# Patient Record
Sex: Female | Born: 1955
Health system: Southern US, Community
[De-identification: ages and names within clinical notes are randomized; demographics above are authoritative.]

## PROBLEM LIST (undated history)

## (undated) DIAGNOSIS — E079 Disorder of thyroid, unspecified: Secondary | ICD-10-CM

## (undated) DIAGNOSIS — E119 Type 2 diabetes mellitus without complications: Secondary | ICD-10-CM

---

## 2002-06-24 ENCOUNTER — Encounter: Payer: Self-pay | Admitting: Neurosurgery

## 2002-06-24 ENCOUNTER — Inpatient Hospital Stay (HOSPITAL_COMMUNITY): Admission: RE | Admit: 2002-06-24 | Discharge: 2002-06-26 | Payer: Self-pay | Admitting: Neurosurgery

## 2002-06-25 ENCOUNTER — Encounter: Payer: Self-pay | Admitting: Cardiology

## 2002-07-16 ENCOUNTER — Encounter: Admission: RE | Admit: 2002-07-16 | Discharge: 2002-10-14 | Payer: Self-pay | Admitting: Family Medicine

## 2003-02-13 ENCOUNTER — Encounter: Payer: Self-pay | Admitting: Emergency Medicine

## 2003-02-13 ENCOUNTER — Emergency Department (HOSPITAL_COMMUNITY): Admission: EM | Admit: 2003-02-13 | Discharge: 2003-02-13 | Payer: Self-pay | Admitting: Emergency Medicine

## 2003-05-29 ENCOUNTER — Encounter: Payer: Self-pay | Admitting: Family Medicine

## 2003-05-29 ENCOUNTER — Encounter: Admission: RE | Admit: 2003-05-29 | Discharge: 2003-05-29 | Payer: Self-pay | Admitting: Family Medicine

## 2003-06-04 ENCOUNTER — Encounter: Admission: RE | Admit: 2003-06-04 | Discharge: 2003-08-26 | Payer: Self-pay | Admitting: Neurosurgery

## 2003-12-11 ENCOUNTER — Ambulatory Visit (HOSPITAL_COMMUNITY): Admission: RE | Admit: 2003-12-11 | Discharge: 2003-12-11 | Payer: Self-pay | Admitting: Neurosurgery

## 2004-05-31 ENCOUNTER — Other Ambulatory Visit: Admission: RE | Admit: 2004-05-31 | Discharge: 2004-05-31 | Payer: Self-pay | Admitting: Family Medicine

## 2005-07-17 ENCOUNTER — Other Ambulatory Visit: Admission: RE | Admit: 2005-07-17 | Discharge: 2005-07-17 | Payer: Self-pay | Admitting: Family Medicine

## 2005-08-11 ENCOUNTER — Encounter: Admission: RE | Admit: 2005-08-11 | Discharge: 2005-08-11 | Payer: Self-pay | Admitting: Neurosurgery

## 2006-11-28 ENCOUNTER — Encounter (INDEPENDENT_AMBULATORY_CARE_PROVIDER_SITE_OTHER): Payer: Self-pay | Admitting: Specialist

## 2006-11-28 ENCOUNTER — Inpatient Hospital Stay (HOSPITAL_COMMUNITY): Admission: EM | Admit: 2006-11-28 | Discharge: 2006-11-29 | Payer: Self-pay | Admitting: Emergency Medicine

## 2007-09-09 ENCOUNTER — Other Ambulatory Visit: Admission: RE | Admit: 2007-09-09 | Discharge: 2007-09-09 | Payer: Self-pay | Admitting: Family Medicine

## 2007-10-01 ENCOUNTER — Inpatient Hospital Stay (HOSPITAL_COMMUNITY): Admission: EM | Admit: 2007-10-01 | Discharge: 2007-10-04 | Payer: Self-pay | Admitting: Emergency Medicine

## 2008-11-04 ENCOUNTER — Encounter: Admission: RE | Admit: 2008-11-04 | Discharge: 2008-11-04 | Payer: Self-pay | Admitting: Family Medicine

## 2008-11-12 ENCOUNTER — Encounter: Admission: RE | Admit: 2008-11-12 | Discharge: 2008-11-12 | Payer: Self-pay | Admitting: Family Medicine

## 2008-11-18 ENCOUNTER — Encounter: Admission: RE | Admit: 2008-11-18 | Discharge: 2008-11-18 | Payer: Self-pay | Admitting: Family Medicine

## 2010-04-27 ENCOUNTER — Encounter: Admission: RE | Admit: 2010-04-27 | Discharge: 2010-04-27 | Payer: Self-pay | Admitting: Family Medicine

## 2010-05-06 ENCOUNTER — Encounter: Admission: RE | Admit: 2010-05-06 | Discharge: 2010-06-13 | Payer: Self-pay | Admitting: Family Medicine

## 2010-11-02 ENCOUNTER — Encounter (HOSPITAL_BASED_OUTPATIENT_CLINIC_OR_DEPARTMENT_OTHER)
Admission: RE | Admit: 2010-11-02 | Discharge: 2010-11-15 | Payer: Self-pay | Source: Home / Self Care | Attending: General Surgery | Admitting: General Surgery

## 2010-11-02 ENCOUNTER — Ambulatory Visit (HOSPITAL_COMMUNITY)
Admission: RE | Admit: 2010-11-02 | Discharge: 2010-11-02 | Payer: Self-pay | Source: Home / Self Care | Attending: General Surgery | Admitting: General Surgery

## 2010-11-09 LAB — GLUCOSE, CAPILLARY: Glucose-Capillary: 166 mg/dL — ABNORMAL HIGH (ref 70–99)

## 2010-11-16 ENCOUNTER — Encounter (HOSPITAL_BASED_OUTPATIENT_CLINIC_OR_DEPARTMENT_OTHER): Payer: Self-pay

## 2010-12-21 ENCOUNTER — Encounter (HOSPITAL_BASED_OUTPATIENT_CLINIC_OR_DEPARTMENT_OTHER): Payer: Self-pay

## 2011-01-18 ENCOUNTER — Encounter (HOSPITAL_BASED_OUTPATIENT_CLINIC_OR_DEPARTMENT_OTHER): Payer: Self-pay

## 2011-02-28 NOTE — Consult Note (Signed)
NAME:  Allison [name], Allison [name]                    ACCOUNT NO.:  [id number]   MEDICAL RECORD NO.:  [id number]          PATIENT TYPE:  INP   LOCATION:  6713                         FACILITY:  [facility]   PHYSICIAN:  Danise Edge, M.D.   DATE OF BIRTH:  March 22, 1956   DATE OF CONSULTATION:  10/03/2007  DATE OF DISCHARGE:                                 CONSULTATION   REASON FOR CONSULTATION:  We were asked to see Allison [name] today in  consultation for colitis by Dr. Donnalee Curry of [facility].   HISTORY OF PRESENT ILLNESS:  This is a 55 year old female who developed  a tooth abscess recently and was put on amoxicillin.  She had taken it  for approximately 6 days prior to start of her symptoms.  She was also  started on hydrocodone.  She became constipated for 2 days and then  Tuesday night started to experience liquid bowel movements in which she  noticed some red blood.  She tells me that over the past 7 days, she has  been having 7-8 liquid bowel movements per day with an increase in  abdominal pain.  She describes no emesis.  She does have a history of  diabetes mellitus.  On CT scan, it shows that she had a thickening  segment in her ascending colon as well as part of her transverse colon.   PRIMARY CARE PHYSICIAN:  Dr. Sigmund Hazel at [facility].   PAST MEDICAL HISTORY:  1. Significant for atrial fibrillation.  2. Type 2 diabetes.  3. Tooth abscess in December 2008.  4. Hypothyroidism.   PAST SURGICAL HISTORY:  1. Cervical laminectomy.  2. Laparoscopic appendectomy.  3. The patient has never had a colonoscopy before.   ALLERGIES:  NO KNOWN DRUG ALLERGIES.   CURRENT MEDICATIONS:  1. Lipitor.  2. Levothyroxine.  3. Glyburide.  4. Metformin.   SOCIAL HISTORY:  Significant for no drugs, alcohol or tobacco.   FAMILY HISTORY:  Significant for no IBD, colon cancer or known bowel  problems.   PHYSICAL EXAMINATION:  GENERAL:  She is alert and oriented in no  apparent  distress.  VITAL SIGNS:  Temperature is 99, pulse 75, respirations are 18, blood  pressure is 107/70.  HEART:  Regular rate and rhythm with no murmurs, rubs or gallops  appreciated.  LUNGS:  Clear to auscultation bilaterally.  ABDOMEN:  Soft, tender in the lower quadrants bilaterally and has good  bowel sounds.   LABORATORY DATA:  Potassium 3.4, BUN 4, creatinine 0.57, hemoglobin  11.9, hematocrit 37.1, white count 8.9, platelets 136,000.  She is  guaiac positive.  Her lipase is normal.  LFTs are abnormal in that her  bilirubin is slightly high at 1.3.  Stool cultures including C. diff,  Giardia, Cryptosporidium and routine stools for enteric pathogen have  all been negative.   DIAGNOSTICS:  CT scan done yesterday shows a long segment of wall  thickening in the ascending and transverse colon.   ASSESSMENT:  Dr. Danise Edge has seen and examined the patient,  collected a history and reviewed the  chart.  His impression is that this  is a 55 year old female with colitis.  We are unsure at this point  whether it is infectious or ischemic.  Her hemoglobin is stable as is  the patient.  We will go ahead and replete her potassium and prep for a  colonoscopy this afternoon to be scheduled for tomorrow morning October 04, 2007.   Thanks very much for this consultation.      Allison Police, PA    ______________________________  Danise Edge, M.D.    MLY/MEDQ  D:  10/03/2007  T:  10/04/2007  Job:  [id number]   cc:   Danise Edge, M.D.  Kela Millin, M.D.  Sigmund Hazel, M.D.

## 2011-02-28 NOTE — H&P (Signed)
NAME:  Allison [name], Allison [name]                    ACCOUNT NO.:  [id number]   MEDICAL RECORD NO.:  [id number]          PATIENT TYPE:  INP   LOCATION:  1826                         FACILITY:  [facility]   PHYSICIAN:  Hollice Espy, M.D.DATE OF BIRTH:  July 28, 1956   DATE OF ADMISSION:  10/01/2007  DATE OF DISCHARGE:                              HISTORY & PHYSICAL   PCP:  Sigmund Hazel, MD   CHIEF COMPLAINT:  Abdominal pain.   HISTORY OF PRESENT ILLNESS:  The patient is a 55 year old [place]  female with past medical history of diabetes mellitus, who had some  followup dental work done about a month ago.  Since that time she has  had problems with severe pain and an abscess in the tooth requiring  antibiotics and OxyContin pain medication called in.  This medication  was started approximately 7 days ago.  She was able to take a couple of  those, but still having problems with severe nausea and vomiting.  These  symptoms have slowly persisted and she tells me that she stopped having  bowel movements about 3 days ago.  Her generalized abdominal pain  continued to progress.  The last several days she has had problems with  diarrhea as well.  Today she was feeling quite weak and noticed she was  having some bloody stool.  She has continued her generalized abdominal  pain, described as sharp, as well as crampy all over with no focal areas  of finding.  She went to [facility], who gave her PPI and  referred her here when her symptoms persisted.  When the patient came to  the emergency room she had labs done.  However, they did not do any type  of x-ray.  Her labs noted on her liver function test a total bilirubin  of 1.3 with an indirect of 1 which is the high end of normal.  She was  hemoccult positive.  Her labs did note normal electrolytes and normal H  and H, but did note a white count of 14.3 with 73% shift.  Stool was  sent for culture.  Currently the patient complains of occasional  episodes of severe generalized abdominal pain, cramping.  She feels  nauseated as well.  She denies any headaches or vision changes.  No  dysphagia.  No chest pain or palpitations.  No shortness of breath,  wheeze, or cough.  No hematuria or dysuria.  She has had both  constipation and diarrhea.  No focal extremity numbness, weakness, or  pain.   REVIEW OF SYSTEMS:  Otherwise negative.   PAST MEDICAL HISTORY:  Includes diabetes mellitus and hypothyroidism.   MEDICATIONS:  She is on Synthroid 75, Lipitor 10, glyburide 4, metformin  1000.   ALLERGIES:  NO KNOWN DRUG ALLERGIES.   SOCIAL HISTORY:  No tobacco, alcohol, or drug use.   FAMILY HISTORY:  Noncontributory.   PHYSICAL EXAMINATION:  VITAL SIGNS:  Temperature 96.9.  Heart rate 89.  Blood pressure 116/73.  Respirations 16.  O2 sat 97% on room air.  GENERAL:  She is  alert and oriented x3, in some moderate distress  secondary to abdominal pain.  HEENT:  Normocephalic, atraumatic.  Mucous membranes are dry.  She has  no carotid bruits.  HEART:  Regular rate and rhythm.  S1 and S2.  LUNGS:  Clear to auscultation bilaterally.  ABDOMEN:  Soft, distended.  Generalized tenderness.  Scant bowel sounds.  EXTREMITIES:  Show no clubbing, cyanosis, or edema.   LAB WORK:  Stool cultures pending.  Sodium 136, potassium 3.6, chloride  104, bicarb 23, BUN 6, creatinine 0.6, glucose 168.  INR normal.  C.  diff. culture pending.  LFTs noted a total bili of 1.3, indirect of 1,  hemoccult positive.  UA noted being only __________ for ketones.  Lipase  44.  White count 14.3.  H and H 14.2 and 45.  MCV of 65.  Platelet count  190.  No shifts.   ASSESSMENT AND PLAN:  1. Abdominal pain with associated nausea, vomiting, diarrhea, bloody      and increased leukocytosis.  Suspect a viral gastroenteritis versus      diverticulitis, versus constipation, versus ileus, check abdominal      x-ray.  In the meantime will treat with n.p.o., intravenous  fluids,      pain and nausea control, plus proton pump inhibitor.  2. Diabetes mellitus, n.p.o. plus sliding scale.  3. Hypothyroidism, holding Synthroid.      Hollice Espy, M.D.  Electronically Signed     SKK/MEDQ  D:  10/01/2007  T:  10/01/2007  Job:  [id number]   cc:   Sigmund Hazel, M.D.

## 2011-02-28 NOTE — Op Note (Signed)
NAME:  Halterman, Milderd                    ACCOUNT NO.:  [id number]   MEDICAL RECORD NO.:  [id number]          PATIENT TYPE:  INP   LOCATION:  6713                         FACILITY:  [facility]   PHYSICIAN:  Danise Edge, M.D.   DATE OF BIRTH:  02-Oct-1956   DATE OF PROCEDURE:  10/04/2007  DATE OF DISCHARGE:  10/04/2007                               OPERATIVE REPORT   REFERRING PHYSICIAN:  Dr. Sigmund Hazel procedure.   INDICATIONS:  Ms. Rin Gorton is a 55 year old [place] female admitted to  the hospital to evaluate and treat abdominal pain.  She recently  developed a tooth abscess and was placed on amoxicillin.  After six days  of amoxicillin, she developed constipation and then abdominal pain.  She  underwent a CT scan of the abdomen and pelvis which reveals colonic  thickening in the ascending colon and transverse colon.   PAST MEDICAL HISTORY:  1. Atrial fibrillation.  2. Diabetes mellitus.  3. Tooth abscess September 23, 2007.  4. Hypothyroidism.  5. Cervical laminectomy.  6. Recent laparoscopic appendectomy.   CHRONIC MEDICATIONS:  1. Lipitor.  2. Levothyroxine.  3. Glyburide.  4. Metformin.   ENDOSCOPIST:  Danise Edge, M.D.   PREMEDICATION:  Fentanyl 75 mcg, Versed 8 mg.   PROCEDURE:  After obtaining informed consent, Ms. Koors was placed in the  left lateral decubitus position.  I administered intravenous fentanyl  and intravenous Versed to achieve conscious sedation for the procedure.  The patient's blood pressure, oxygen saturation and cardiac rhythm were  monitored throughout the procedure and documented in the medical record.   Anal inspection and digital rectal exam were normal.  The pediatric  Pentax colonoscope was introduced into the rectum and advanced to the  cecum.  A normal-appearing ileocecal valve and appendiceal orifice were  identified.  Colonic preparation for the exam today was excellent.   Rectum normal.  Retroflexed view of the distal rectum normal.  Sigmoid colon and descending colon normal.  Splenic flexure normal.  Trans, cecum and ileocecal valve normal.   ASSESSMENT:  Normal proctocolonoscopy to the cecum.  No endoscopic  evidence for the presence of colorectal neoplasia or colonic mucosal  inflammation.   RECOMMENDATIONS:  Resume regular diet and discharge from the hospital           ______________________________  Danise Edge, M.D.     MJ/MEDQ  D:  10/04/2007  T:  10/05/2007  Job:  [id number]   cc:   Sigmund Hazel, M.D.

## 2011-03-03 NOTE — Op Note (Signed)
NAME:  Age, [name]                    ACCOUNT NO.:  [id number]   MEDICAL RECORD NO.:  [id number]          PATIENT TYPE:  OBV   LOCATION:  1843                         FACILITY:  [facility]   PHYSICIAN:  Anselm Pancoast. Weatherly, M.D.DATE OF BIRTH:  01/04/1956   DATE OF PROCEDURE:  11/28/2006  DATE OF DISCHARGE:                               OPERATIVE REPORT   PREOPERATIVE DIAGNOSIS:  Acute appendicitis, possibly retrocecal.   POSTOPERATIVE DIAGNOSIS:  Acute appendicitis, retrocecal.   OPERATIONS:  Laparoscopic appendectomy.   HISTORY:  Allison [name] is a 55 year old [place] female who came to the  emergency room with kind of onset of pain off and on for about a week.  She has seen her regular physician and has been on various antibiotics.  The pain is kind of shifted to the flank area and then they did a CT in  the ER that was read as acute appendicitis.  The appendix kind of in a  retrocecal area.  She was seen by Revonda Standard and myself and on exam she is  definitely mildly tender in the right lower quadrant.  White count was  14,500.  There was a left shift of her differential and a urinalysis was  unremarkable and her electrolytes etc. were fine and I recommended we  proceed on with a laparoscopic appendectomy.  Hopefully it can be  approached and she is kind of short and stocky and would be more  difficult open incision.  The patient was given Primaxin as she says she  is allergic to various antibiotics. It sounds like to me that these are  more kind of side effects and not true allergic reactions, but anyway we  gave her 500 mg of Primaxin which appeared to be tolerated okay.   DESCRIPTION OF PROCEDURE:  The patient was taken to the operative suite.  Induction of general anesthesia endotracheal tube. Abdomen prepped, a  Foley catheter was placed sterilely and then a small incision was made  below the umbilicus.  The fascia was identified, picked up, carefully  opened and a pursestring of 0 Vicryl  placed and Hasson cannula  introduced.  She has got a very generous omentum and a lot of loops of  small bowel in right lower quadrant.  The 5 mm port was placed kind of  in right upper quadrant.  The 10/11 in the left lower quadrant and then  we put her in steep Trendelenburg, rotated to the left trying to get the  small bowel to kind of float out of the pelvis so we could see the  cecum, etc.  There were adhesions over on the cecum, medially and these  were taken down to get truly retrocecal area and then finally after  following the base of the cecum down I could identify the appendix. It  was markedly inflamed all around this but not actual frank abscess. The  harmonic scalpel was used to kind of free up the attachments of the  mesentery and etc., so I could get to the base of the appendix. I then  grasped the base  of the appendix about 2 cm from its junction with a  million dollar so we could get a good handle and kind of pull up it.  Then we very carefully dissected down freeing up the appendix in the  retrocecal area.  The appendix was not as large as I would have thought  it was going to be from looking at her CT but we certainly do not see  anything else in this area and after the appendiceal mesentery had been  divided, I was able to put the GIA under the base of the appendix cecum  and fired it and then placed the appendix in EndoCatch bag. On looking  at the appendix at the end of case it definitely appears to be inflamed,  I do not see anything rupture and I think we certainly taken the  complete appendix out.  The mesentery area was all thoroughly irrigated,  aspirated did not see any obvious evidence of bleeding and we used about  a liter of irrigating fluid to make sure that the best we could tell  that it was dry and then released. We took out the 10/11 and the 5 mL  port, released carbon dioxide.  I closed the fascia with a figure-of-  eight of 0 Vicryl in addition to the  pursestring and anesthetized fascia  at the umbilicus a little bit and then closed the  subcutaneous wounds with 4-0 Vicryl.  Benzoin, Steri-Strips on the skin.  The patient tolerated procedure nicely and was sent to recovery room in  stable postop condition.  She will be on 6730. I am going to give her  another dose or two of antibiotics since it was pretty difficult  dissection but I do not think we had any spillage.           ______________________________  Anselm Pancoast. Zachery Dakins, M.D.     WJW/MEDQ  D:  11/28/2006  T:  11/28/2006  Job:  [id number]

## 2011-03-03 NOTE — H&P (Signed)
NAME:  Allison [name], Allison [name]                    ACCOUNT NO.:  [id number]   MEDICAL RECORD NO.:  [id number]          PATIENT TYPE:  EMS   LOCATION:  MAJO                         FACILITY:  [facility]   PHYSICIAN:  Anselm Pancoast. Weatherly, M.D.DATE OF BIRTH:  Jun 05, 1956   DATE OF ADMISSION:  11/28/2006  DATE OF DISCHARGE:                              HISTORY & PHYSICAL   CHIEF COMPLAINT:  Abdominal pain.   HISTORY OF PRESENT ILLNESS:  Allison [name] is a 55 year old female patient,  who about 2 weeks ago had vague symptoms of left upper quadrant and left  flank pain, with some pain radiating down her hip and legs, was presumed  to have a UTI by her primary care physician and started initially on  Augmentin p.o.  The patient developed diarrhea, so this was subsequently  changed to Cipro and Flagyl.  Her symptoms never really resolved, and  last night she developed severe pain in the same areas with associated  nausea and vomiting.  She presented to the ER and was found to have a  white count of 14,500, no complaints of pain in any other portions of  her body.  CT scan was done because of the nausea and vomiting, and the  patient was found to have a very edematous appendix consistent with  acute appendicitis.  Repeat exam after the CT scan finally revealed that  the patient was tender in the right lower quadrant.  The patient states  that since receiving Dilaudid upon arrival, the left upper quadrant left  flank pain has resolved.  Surgical admission has been requested.   REVIEW OF SYSTEMS:  The patient again has had vague symptoms starting  about 2 weeks ago with fatigue, left upper quadrant and left flank pain,  anorexia and nausea.  As noted, intolerance to Augmentin and Cipro and  Flagyl, with GI symptoms.   PAST HISTORY:  1. Hypothyroidism.  2. Diabetes, on oral agents.  3. Dyslipidemia.   PAST SURGICAL HISTORY:  Tubal ligation.   FAMILY HISTORY:  Noncontributory.   SOCIAL HISTORY:  No alcohol, no  tobacco.  She is married.   DRUG ALLERGIES:  None.   CURRENT MEDICATIONS:  Include:  1. Levothyroxine 75 mcg daily.  2. Lipitor 10 mg daily.  3. Glimepiride 4 mg daily.  4. Metformin 1000 grams daily.   PHYSICAL EXAMINATION:  GENERAL:  Toxic-appearing female patient,  complaining of significant nausea, only complains of right lower  quadrant abdominal pain with palpation over this region.  VITAL SIGNS:  Temperature 97.9, BP 132/76, pulse 90 and regular,  respirations 20.  NEURO:  Patient is alert and oriented x3, moving all extremities x4.  No  focal deficits.  HEENT:  Head is normocephalic.  Sclerae non-injected.  NECK:  Supple.  No adenopathy.  CHEST:  Bilateral lung sounds clear to auscultation.  Respiratory effort  is non-labored.  She is on room air.  CARDIAC:  S1-S2.  No rubs, murmurs or gallops.  No JVD. Pulse is  regular.  ABDOMEN: Distended, slightly tympanitic.  No bowel sounds are  auscultated.  She  is exquisitely tender over the right lower quadrant,  over McBurney point with guarding and rebounding.  EXTREMITIES:  Symmetrical in appearance without edema, cyanosis or  clubbing.  Pulses palpable.  SKIN:  The patient was somewhat diaphoretic, especially on her back  region.   LAB AND X-RAY:  Sodium 136, potassium 4.3, glucose 224, BUN 4,  creatinine 0.4.  White count 14,500, neutrophils 81%, hemoglobin 13.1,  platelets 195,000.  CT of the abdomen and pelvis again reveals a right  ovarian cyst, left ovarian ovary normal, enlarged appendix consistent  with acute appendicitis without abscess formation.   IMPRESSION:  1. Acute appendicitis.  2. Nausea and vomiting and hypovolemia.  3. Hypothyroidism.  4. Uncontrolled diabetes.   PLAN:  1. Admit the patient to the general medical floor.  2. Plan on operative intervention today with laparoscopic      appendectomy.  3. Empiric Primaxin versus __________ ER.  4. Dilaudid for pain, Zofran and Phenergan for nausea.   5. N.P.O. at present time, IV fluids at 150 an hour.  We will use      dextrose initially, and then in the postoperative period she will      need to be changed over to normal saline.  6. Glucometer checks q.4 h.      Allison L. Rennis Harding, N.P.    ______________________________  Anselm Pancoast. Zachery Dakins, M.D.    ALE/MEDQ  D:  11/28/2006  T:  11/28/2006  Job:  [id number]

## 2011-03-03 NOTE — Discharge Summary (Signed)
NAME:  Allison [name], Allison [name]                    ACCOUNT NO.:  [id number]   MEDICAL RECORD NO.:  [id number]          PATIENT TYPE:  INP   LOCATION:  6730                         FACILITY:  [facility]   PHYSICIAN:  Thornton Park. Daphine Deutscher, MD  DATE OF BIRTH:  08/08/1956   DATE OF ADMISSION:  11/28/2006  DATE OF DISCHARGE:  11/29/2006                               DISCHARGE SUMMARY   CHIEF COMPLAINT:  Abdominal pain   HOSPITAL COURSE:  Allison [name] is a 55 year old who presented and was  admitted on November 29, 2006, with abdominal pain and CT scan because  of nausea and vomiting was performed which showed her to have an  edematous appendix consistent with acute appendicitis.  She was admitted  by Dr. Zachery Dakins who took her for laparoscopic appendectomy on November 28, 2006.  Postoperatively, she was sore, but stable and made an  uneventful recovery.  She was discharged on Valentine's Day, November 29, 2006, with some Vicodin in hand for pain and instructions to return  to the office in 1 week.   CONDITION ON DISCHARGE:  Condition was improved.      Thornton Park Daphine Deutscher, MD  Electronically Signed     MBM/MEDQ  D:  01/20/2007  T:  01/21/2007  Job:  [id number]

## 2011-03-03 NOTE — Discharge Summary (Signed)
NAME:  Allison [name], Allison [name]                    ACCOUNT NO.:  [id number]   MEDICAL RECORD NO.:  [id number]          PATIENT TYPE:  INP   LOCATION:  6713                         FACILITY:  [facility]   PHYSICIAN:  Kela Millin, M.D.DATE OF BIRTH:  May 09, 1956   DATE OF ADMISSION:  10/01/2007  DATE OF DISCHARGE:  10/04/2007                               DISCHARGE SUMMARY   DISCHARGE DIAGNOSES:  1. Diarrhea - antibiotic associated versus viral gastroenteritis.  2. Diabetes mellitus.  3. Hypokalemia - resolved.  4. Hypothyroidism.   CONSULTATIONS:  Gastroenterology, Dr. Danise Edge.   PROCEDURES AND STUDIES:  1. CT scan of abdomen and pelvis - moderate to severe colitis      involving the ascending and transverse colon.  No evidence of      abscess.  2. Proctocolonoscopy to the cecum - no endoscopic evidence for      presence of colorectal neoplasia for colonic mucosal inflammation -      per Dr. Laural Benes.   BRIEF HISTORY:  The patient is a 55 year old [place] female with  above-listed medical problems who presented with complaints of abdominal  pain.  She reported that she had had some dental work done about a month  prior to admission and since that time had had severe pain and an  abscess in tooth requiring antibiotics and was also taking OxyContin for  pain.  She stated that she had been having nausea and vomiting and that  for some time she was constipated.  In the several days prior to  admission she reported having diarrhea.  She also was having generalized  abdominal pain described as sharp, crampy and diffuse.  She went to  [facility] and she was given a proton pump inhibitor but her  symptoms persisted and so she came to the ER.  She was noted to be  Hemoccult positive in the ER and her white cell count 14.3.  She was  admitted for further evaluation and management.   Please see the full admission history and physical dictated on October 01, 2007 by Dr. Rito Ehrlich for  the details of the admission physical exam  as well as the laboratory data.   HOSPITAL COURSE:  1. Diarrhea with abdominal pain - question antibiotic-associated      versus viral gastroenteritis.  The patient was kept n.p.o. upon      admission and the CT scan was done which revealed the above      findings.  She also had stool studies done which were negative for      C. diff.  Also, no suspicious colonies.  She was initially      empirically placed on Cipro and Flagyl and gastroenterology was      consulted.  Dr. Laural Benes saw the patient and proctocolonoscopy was      done which was within normal limits revealing no inflammation.      Following this, Dr. Laural Benes recommended that the patient be      discharged home.  Her abdominal pain improved and she was started  on a p.o. diet which she was tolerating well.  Her diarrhea also      resolved.  The antibiotics were discontinued, and as all other      workup was negative, she was discharged to follow up with her      primary care physician.  2. Diabetes mellitus - her Accu-Cheks were monitored and she was      covered with sliding scale insulin while she was n.p.o.  She is to      continue her outpatient medications upon discharge.  3. Hypothyroidism - the patient is to continue her Synthroid on      discharge.  4. Hypokalemia - potassium was replaced while she was in the hospital.  5. History of tooth abscess - the patient was to follow up with her      dentist upon discharge.   DISCHARGE MEDICATIONS:  1. Metformin 1000 p.o. b.i.d.  2. Glyburide 4 mg p.o. daily.  3. Lipitor 10 mg p.o. daily.  4. Levothyroxine 75 mcg p.o. daily.   DISCHARGE CONDITION:  Improved.  Stable.   FOLLOW-UP CARE:  Primary care physician in a week.      Kela Millin, M.D.  Electronically Signed     ACV/MEDQ  D:  10/24/2007  T:  10/24/2007  Job:  [id number]   cc:   Sigmund Hazel, M.D.

## 2011-03-03 NOTE — Discharge Summary (Signed)
NAME:  [name], Allison                    ACCOUNT NO.:  [id number]   MEDICAL RECORD NO.:  [id number]          PATIENT TYPE:  INP   LOCATION:  6730                         FACILITY:  [facility]   PHYSICIAN:  Anselm Pancoast. Weatherly, M.D.DATE OF BIRTH:  15-Feb-1956   DATE OF ADMISSION:  11/28/2006  DATE OF DISCHARGE:  11/29/2006                               DISCHARGE SUMMARY   DISCHARGE DIAGNOSIS:  Acute appendicitis, retrocecal.   POSTOPERATIVE DIAGNOSIS:  Acute appendicitis, retrocecal.   OPERATIONS:  Laparoscopic appendectomy.   HISTORY:  Allison [name] is a 55 year old female who has had 2 weeks of kind of  right-sided abdominal pain with nausea and vomiting and pain kind of in  the right flank.  She presented to her primary care physician and was  started on Augmentin, thinking that this was a urinary tract infection.  The patient developed diarrhea and then was changed to Cipro and Flagyl.  Her symptoms never really resolved, and on the night prior to her  admission, she developed severe pain in the lower abdomen with nausea  and vomiting.  She presented to the emergency room and found to have a  white count 14,500, no complaints of pain in other areas of her body.  CT showed nausea and vomiting and found to have a very edematous  appendix consistent with acute appendicitis.  The patient was definitely  tender in the lower abdomen.  She was given Dilaudid on arrival, and a  surgical consultation was requested.  The patient's abdomen was  distended, slightly tympanitic, no active bowel sounds, and I  recommended we proceed on with a laparoscopic appendectomy.  I thought  the patient was kind of mildly hypovolemic because of nausea and  vomiting.  She was started on intravenous fluids, given antibiotics, and  taken to surgery where I did a laparoscopic appendectomy finding a  retrocecal appendix that was not ruptured.  Postoperatively, she did  satisfactorily.  She was mildly nauseous for the first 12  hours,  complained of a headache, and then the patient was switched to oral  Vicodin, and she tolerated this and then desired to be discharged  the  day after surgery.  Her appendix was acutely inflamed on pathology  examination.  Her incisions were healing satisfactorily at the time of  discharge.  She will be followed in the office in 1 week and understands  to keep Steri-Strips on for approximately a week.           ______________________________  Anselm Pancoast. Zachery Dakins, M.D.     WJW/MEDQ  D:  01/29/2007  T:  01/29/2007  Job:  [id number]

## 2011-03-03 NOTE — Op Note (Signed)
NAME:  Allison [name], Allison [name]                                ACCOUNT NO.:  [id number]   MEDICAL RECORD NO.:  [id number]                   PATIENT TYPE:  INP   LOCATION:  3106                                 FACILITY:  [facility]   PHYSICIAN:  Colleen Can. Deborah Chalk, M.D.            DATE OF BIRTH:  1955-11-21   DATE OF PROCEDURE:  06/24/2002  DATE OF DISCHARGE:                                 OPERATIVE REPORT   HISTORY:  The patient is a 55 year old female who is postoperative from  anterior cervical laminectomy.  She is transferred to the floor and was  basically uneventful in recovery but at the time she arrived to the floor,  she was noted to have a tachycardia with an irregular rhythm.  She had  postoperative nausea and vomiting.  She had no chest pain.  She was noted to  have irregular rhythm and had a vague anterior chest discomfort located  mainly to the right side of her sternum.   Her past medical history is basically unremarkable.  She is felt to be in  good health and did not have extensive preoperative evaluation because of  this finding.  She has been in the [place] for 10 months from [place].   ALLERGIES:  None.   CURRENT MEDICATIONS:  Previously was on Naprosyn and Vicodin.   FAMILY HISTORY:  Unknown.   SOCIAL HISTORY:  She is married.  She is here from [place] for 10 months.  No  smoking.  No alcohol.   REVIEW OF SYSTEMS:  She has some vague chest discomfort with walking over  the last two to three months but it corresponds with the neck pain and  problems with her right arm that she was having before.   PHYSICAL EXAMINATION:  GENERAL:  She is a pleasant 55 year old female in  mild discomfort.  VITAL SIGNS:  Blood pressure is 106/67, heart rate is 130 and irregularly  irregular.  The respiratory rate is 20.  Oxygen saturations 94.  SKIN:  Color good, warm and dry.  Cervical collar is in place.  LUNGS:  Clear.  HEART:  Slight increase in S1.  There is no murmur, no opening snap,  no rub,  no gallop.  ABDOMEN:  Negative.  EXTREMITIES:  Without edema.   Her EKG showed atrial fibrillation with rapid ventricular response and  lateral ST change.   OVERALL IMPRESSION:  1. New atrial fibrillation.  2. Postoperative cervical laminectomy over the anterior approach C5, C6 with     diskectomy.  3. Chest pain.    PLAN:  Will check enzymes, labs, Cardizem drip for rate control.  Will check  2D echocardiogram.  She does have elevated glucose levels.  Will make sure  she does not have diabetes.  Will check the hemoglobin A1C.  Her EKG once  converted to sinus rhythm is felt to be normal.  Colleen Can. Deborah Chalk, M.D.    SNT/MEDQ  D:  06/24/2002  T:  06/25/2002  Job:  [id number]

## 2011-03-03 NOTE — Op Note (Signed)
NAME:  Allison [name], Allison [name]                                ACCOUNT NO.:  [id number]   MEDICAL RECORD NO.:  [id number]                   PATIENT TYPE:  INP   LOCATION:  3172                                 FACILITY:  [facility]   PHYSICIAN:  Clydene Fake, M.D.               DATE OF BIRTH:  December 07, 1955   DATE OF PROCEDURE:  06/24/2002  DATE OF DISCHARGE:                                 OPERATIVE REPORT   PREOPERATIVE DIAGNOSIS:  Herniated nucleus pulposus, C4-5 and C5-6, with  cord compression.   POSTOPERATIVE DIAGNOSIS:  Herniated nucleus pulposus, C4-5 and C5-6, with  cord compression.   PROCEDURE:  Anterior cervical diskectomy and fusion at C4-5 and C5-6 with  allograft and anterior cervical plate.   SURGEON:  Clydene Fake, M.D.   ASSISTANT:  Payton Doughty, M.D.   ANESTHESIA:  General endotracheal tube anesthesia.   ESTIMATED BLOOD LOSS:  Minimal.   BLOOD REPLACED:  None.   DRAINS:  None.   COMPLICATIONS:  None.   INDICATION FOR PROCEDURE::  The patient is a 55 year old woman who has had a  three-month history of pain radiating between her shoulder blades, into the  right shoulder and down the right arm, with numbness going into her thumb  and middle fingers.  She had been on various anti-inflammatories and pain  medications and symptoms were not improving.  MRI was done showing disk  herniation at 4-5 and 5-6 central that does extend behind the vertebral  bodies, compressing the spinal cord at those level.  Patient brought in for  a two-level diskectomy.   DESCRIPTION OF PROCEDURE:  The patient was brought in the operating room and  general anesthesia was induced.  The patient was placed in Holter traction  with 10 pounds, prepped and draped in a sterile fashion.  The site of  incision was injected with 8 cc of 1% lidocaine with epinephrine.  The  incision was then made from the midline to the anterior border of the  sternocleidomastoid muscle on the left side of the neck,  incision taken down  to the platysma, and hemostasis obtained with Bovie cauterization.  The  platysma incised with the Bovie and blunt dissection taken to the anterior  cervical spine.  A needle was placed in the disk space, and an x-ray was  obtained showing this was the 5-6 interspace.  This disk space was incised  with a 15 blade and partial diskectomy performed.  The longus colli muscle  was then reflected laterally on each side using the Bovie and then the self-  retaining retractor was placed.  Distraction pins were placed in C5, C4, and  C6, and the distractor was then used, distracting both interspaces.  Diskectomy was then performed with pituitary rongeurs and curettes.  The  microscope was brought in for microdissection at this point.  A high-speed  drill was used  to remove the end plates and at the 5-6 level, 3 mm or so of  the vertebral body of C5 was removed to help decompress the cord behind the  body of 5.  We found central free fragments.  These were removed.  We then  removed ligamentum flavum and performed bilateral foraminotomies at both  levels, at 4-5 and 5-6.  When we were finished, we had good decompression of  the cord and bilateral foramina.  The cartilaginous end plates were removed  with a high-speed drill.  The depth of the vertebral body was measured and  then at the 5-6 level, a 7 mm Tutogen bone graft was cut to size and tapped  into place.  There was room between the graft and dura after this was  placed.  We then used an 8 mm Tutogen bone graft cut to size and tapped into  place, and again there was room between the bone graft and dura.  After  these were put in place, the distraction pins were removed.  Weight was  removed from the traction, and we had a good position of our bone grafts and  they were firmly in place.  A Tether anterior cervical plate was then placed  over the vertebral bodies from 4 to 6 and two screws placed in the C4 and  two at the C6.   They were final-tightened.  X-ray was obtained, showing good  position of the plate and screws and both bone grafts from C4 to C6.  Retractors were removed and hemostasis obtained with bipolar cauterization  and Gelfoam and thrombin, and Gelfoam was irrigated out.  When we had  absolute hemostasis, the platysma was closed with 3-0 Vicryl interrupted  suture, the subcutaneous tissue was closed with the same, the skin was  closed with Benzoin and Steri-Strips.  A dressing was placed, a soft  cervical collar was placed, and the patient was awoken from anesthesia and  transferred to the recovery room in stable condition.                                                Clydene Fake, M.D.    JRH/MEDQ  D:  06/24/2002  T:  06/24/2002  Job:  [id number]

## 2011-05-26 ENCOUNTER — Other Ambulatory Visit: Payer: Self-pay | Admitting: Family Medicine

## 2011-05-26 DIAGNOSIS — R109 Unspecified abdominal pain: Secondary | ICD-10-CM

## 2011-05-31 ENCOUNTER — Ambulatory Visit
Admission: RE | Admit: 2011-05-31 | Discharge: 2011-05-31 | Disposition: A | Discharge status: Home / Self Care | Payer: Federal, State, Local not specified - PPO | Source: Ambulatory Visit | Attending: Family Medicine | Admitting: Family Medicine

## 2011-05-31 ENCOUNTER — Other Ambulatory Visit: Payer: Self-pay

## 2011-05-31 DIAGNOSIS — R109 Unspecified abdominal pain: Secondary | ICD-10-CM

## 2011-05-31 MED ORDER — IOHEXOL 300 MG/ML  SOLN: 100.0000 mL | Freq: Once | INTRAMUSCULAR | Status: AC | PRN

## 2011-07-21 LAB — CLOSTRIDIUM DIFFICILE EIA: C difficile Toxins A+B, EIA: NEGATIVE

## 2011-07-21 LAB — DIFFERENTIAL
Basophils Absolute: 0
Basophils Relative: 0
Eosinophils Absolute: 0.1 — ABNORMAL LOW
Eosinophils Relative: 4
Lymphocytes Relative: 24
Monocytes Absolute: 0.5
Monocytes Absolute: 1
Neutro Abs: 5.8
Neutrophils Relative %: 73

## 2011-07-21 LAB — CBC
HCT: 36.8
HCT: 37.1
HCT: 39.5
Hemoglobin: 11.9 — ABNORMAL LOW
Hemoglobin: 12.4
MCHC: 31.4
MCHC: 31.6
MCHC: 32.2
MCV: 65.1 — ABNORMAL LOW
Platelets: 136 — ABNORMAL LOW
Platelets: 136 — ABNORMAL LOW
Platelets: 190
RBC: 6 — ABNORMAL HIGH
RDW: 15.4
RDW: 15.8 — ABNORMAL HIGH
RDW: 15.8 — ABNORMAL HIGH
WBC: 6.6

## 2011-07-21 LAB — HEPATIC FUNCTION PANEL
Alkaline Phosphatase: 83
Bilirubin, Direct: 0.3
Indirect Bilirubin: 1 — ABNORMAL HIGH
Total Protein: 7

## 2011-07-21 LAB — COMPREHENSIVE METABOLIC PANEL
ALT: 21
AST: 21
Albumin: 3.2 — ABNORMAL LOW
Alkaline Phosphatase: 59
Chloride: 107
GFR calc Af Amer: >60
Potassium: 3.5
Sodium: 142
Total Bilirubin: 1.1
Total Protein: 5.6 — ABNORMAL LOW

## 2011-07-21 LAB — I-STAT 8, (EC8 V) (CONVERTED LAB)
Glucose, Bld: 168 — ABNORMAL HIGH
HCT: 50 — ABNORMAL HIGH
Hemoglobin: 17 — ABNORMAL HIGH
Operator id: 288831
Potassium: 3.6
Sodium: 136
TCO2: 24

## 2011-07-21 LAB — OCCULT BLOOD X 1 CARD TO LAB, STOOL: Fecal Occult Bld: POSITIVE

## 2011-07-21 LAB — BASIC METABOLIC PANEL
BUN: 4 — ABNORMAL LOW
CO2: 24
CO2: 24
Calcium: 8.6
Chloride: 108
GFR calc Af Amer: >60
GFR calc non Af Amer: >60
Glucose, Bld: 100 — ABNORMAL HIGH
Glucose, Bld: 173 — ABNORMAL HIGH
Potassium: 3.5
Sodium: 137

## 2011-07-21 LAB — URINALYSIS, ROUTINE W REFLEX MICROSCOPIC
Bilirubin Urine: NEGATIVE
Hgb urine dipstick: NEGATIVE
Ketones, ur: 15 — AB
Protein, ur: NEGATIVE
Urobilinogen, UA: 0.2

## 2011-07-21 LAB — STOOL CULTURE

## 2011-07-21 LAB — POCT I-STAT CREATININE: Operator id: 288831

## 2011-07-21 LAB — TYPE AND SCREEN: ABO/RH(D): O POS

## 2011-07-21 LAB — EHEC TOXIN BY EIA, STOOL

## 2011-07-21 LAB — LIPASE, BLOOD: Lipase: 44

## 2011-09-04 NOTE — H&P (Signed)
  NAME:  Mcclurkin, Evola                    ACCOUNT NO.:  [id number]  MEDICAL RECORD NO.:  [id number]          PATIENT TYPE:  OUT  LOCATION:  XRAY                         FACILITY:  [facility]  PHYSICIAN:  Ardath Sax, M.D.     DATE OF BIRTH:  [birth year]  DATE OF ADMISSION:  11/02/2010 DATE OF DISCHARGE:                             HISTORY & PHYSICAL   Allison [name] is a 55 year old lady who dropped a heavy metal object on the dorsal aspect of her right foot 3 months ago and it took a very long time healing and she was sent here for evaluation, but it really has healed over.  She has a somewhat thickened scar in this area, but it is epithelialized and I just told her to keep it moisturized with some sort of moisturizing cream.  We put some Bag Balm on it at this time and she will come back in a week just for [place] to check.  Other than that, she is a healthy lady, she takes no medicine, and has no other illnesses.  We will see her next week to make sure everything is still healed.     Ardath Sax, M.D.     PP/MEDQ  D:  11/02/2010  T:  11/03/2010  Job:  [id number]  Electronically Signed by Ardath Sax  on 09/04/2011 01:37:58 PM

## 2011-10-25 ENCOUNTER — Other Ambulatory Visit: Payer: Self-pay | Admitting: Family Medicine

## 2011-10-25 ENCOUNTER — Other Ambulatory Visit (HOSPITAL_COMMUNITY)
Admission: RE | Admit: 2011-10-25 | Discharge: 2011-10-25 | Disposition: A | Discharge status: Home / Self Care | Payer: Federal, State, Local not specified - PPO | Source: Ambulatory Visit | Attending: Family Medicine | Admitting: Family Medicine

## 2011-10-25 DIAGNOSIS — Z1159 Encounter for screening for other viral diseases: Secondary | ICD-10-CM | POA: Insufficient documentation

## 2011-10-25 DIAGNOSIS — Z124 Encounter for screening for malignant neoplasm of cervix: Secondary | ICD-10-CM | POA: Insufficient documentation

## 2014-09-21 ENCOUNTER — Emergency Department (HOSPITAL_COMMUNITY): Payer: Federal, State, Local not specified - PPO

## 2014-09-21 ENCOUNTER — Encounter (HOSPITAL_COMMUNITY): Payer: Self-pay | Admitting: *Deleted

## 2014-09-21 ENCOUNTER — Emergency Department (HOSPITAL_COMMUNITY)
Admission: EM | Admit: 2014-09-21 | Discharge: 2014-09-21 | Disposition: A | Discharge status: Home / Self Care | Payer: Federal, State, Local not specified - PPO | Attending: Emergency Medicine | Admitting: Emergency Medicine

## 2014-09-21 DIAGNOSIS — Z79899 Other long term (current) drug therapy: Secondary | ICD-10-CM | POA: Diagnosis not present

## 2014-09-21 DIAGNOSIS — Z792 Long term (current) use of antibiotics: Secondary | ICD-10-CM | POA: Diagnosis not present

## 2014-09-21 DIAGNOSIS — Z8639 Personal history of other endocrine, nutritional and metabolic disease: Secondary | ICD-10-CM | POA: Diagnosis not present

## 2014-09-21 DIAGNOSIS — E119 Type 2 diabetes mellitus without complications: Secondary | ICD-10-CM | POA: Insufficient documentation

## 2014-09-21 DIAGNOSIS — R079 Chest pain, unspecified: Secondary | ICD-10-CM

## 2014-09-21 DIAGNOSIS — R0789 Other chest pain: Secondary | ICD-10-CM | POA: Diagnosis not present

## 2014-09-21 HISTORY — DX: Type 2 diabetes mellitus without complications: E11.9

## 2014-09-21 HISTORY — DX: Disorder of thyroid, unspecified: E07.9

## 2014-09-21 LAB — BASIC METABOLIC PANEL
Anion gap: 13 (ref 5–15)
BUN: 14 mg/dL (ref 6–23)
CALCIUM: 9.6 mg/dL (ref 8.4–10.5)
CO2: 24 mEq/L (ref 19–32)
CREATININE: 0.58 mg/dL (ref 0.50–1.10)
Chloride: 103 mEq/L (ref 96–112)
GLUCOSE: 184 mg/dL — AB (ref 70–99)
POTASSIUM: 4 meq/L (ref 3.7–5.3)
Sodium: 140 mEq/L (ref 137–147)

## 2014-09-21 LAB — CBC
HEMATOCRIT: 42.2 % (ref 36.0–46.0)
HEMOGLOBIN: 13.5 g/dL (ref 12.0–15.0)
MCH: 20.6 pg — AB (ref 26.0–34.0)
MCHC: 32 g/dL (ref 30.0–36.0)
MCV: 64.4 fL — ABNORMAL LOW (ref 78.0–100.0)
Platelets: 224 10*3/uL (ref 150–400)
RBC: 6.55 MIL/uL — ABNORMAL HIGH (ref 3.87–5.11)
RDW: 16.7 % — AB (ref 11.5–15.5)
WBC: 7 10*3/uL (ref 4.0–10.5)

## 2014-09-21 LAB — I-STAT TROPONIN, ED: Troponin i, poc: 0 ng/mL (ref 0.00–0.08)

## 2014-09-21 MED ORDER — ACETAMINOPHEN 325 MG PO TABS
650.0000 mg | ORAL_TABLET | Freq: Once | ORAL | Status: AC
  Administered 2014-09-21: 650 mg via ORAL
  Filled 2014-09-21: qty 2

## 2014-09-21 MED ORDER — ACETAMINOPHEN 500 MG PO TABS: 500.0000 mg | ORAL_TABLET | Freq: Four times a day (QID) | ORAL | Status: AC | PRN

## 2014-09-21 MED ORDER — ONDANSETRON HCL 4 MG PO TABS
4.0000 mg | ORAL_TABLET | Freq: Four times a day (QID) | ORAL | Status: DC
Start: 2014-09-21 — End: 2022-03-01

## 2014-09-21 NOTE — Discharge Instructions (Signed)
Chest Pain (Nonspecific) °It is often hard to give a specific diagnosis for the cause of chest pain. There is always a chance that your pain could be related to something serious, such as a heart attack or a blood clot in the lungs. You need to follow up with your health care provider for further evaluation. °CAUSES  °· Heartburn. °· Pneumonia or bronchitis. °· Anxiety or stress. °· Inflammation around your heart (pericarditis) or lung (pleuritis or pleurisy). °· A blood clot in the lung. °· A collapsed lung (pneumothorax). It can develop suddenly on its own (spontaneous pneumothorax) or from trauma to the chest. °· Shingles infection (herpes zoster virus). °The chest wall is composed of bones, muscles, and cartilage. Any of these can be the source of the pain. °· The bones can be bruised by injury. °· The muscles or cartilage can be strained by coughing or overwork. °· The cartilage can be affected by inflammation and become sore (costochondritis). °DIAGNOSIS  °Lab tests or other studies may be needed to find the cause of your pain. Your health care provider may have you take a test called an ambulatory electrocardiogram (ECG). An ECG records your heartbeat patterns over a 24-hour period. You may also have other tests, such as: °· Transthoracic echocardiogram (TTE). During echocardiography, sound waves are used to evaluate how blood flows through your heart. °· Transesophageal echocardiogram (TEE). °· Cardiac monitoring. This allows your health care provider to monitor your heart rate and rhythm in real time. °· Holter monitor. This is a portable device that records your heartbeat and can help diagnose heart arrhythmias. It allows your health care provider to track your heart activity for several days, if needed. °· Stress tests by exercise or by giving medicine that makes the heart beat faster. °TREATMENT  °· Treatment depends on what may be causing your chest pain. Treatment may include: °¨ Acid blockers for  heartburn. °¨ Anti-inflammatory medicine. °¨ Pain medicine for inflammatory conditions. °¨ Antibiotics if an infection is present. °· You may be advised to change lifestyle habits. This includes stopping smoking and avoiding alcohol, caffeine, and chocolate. °· You may be advised to keep your head raised (elevated) when sleeping. This reduces the chance of acid going backward from your stomach into your esophagus. °Most of the time, nonspecific chest pain will improve within 2-3 days with rest and mild pain medicine.  °HOME CARE INSTRUCTIONS  °· If antibiotics were prescribed, take them as directed. Finish them even if you start to feel better. °· For the next few days, avoid physical activities that bring on chest pain. Continue physical activities as directed. °· Do not use any tobacco products, including cigarettes, chewing tobacco, or electronic cigarettes. °· Avoid drinking alcohol. °· Only take medicine as directed by your health care provider. °· Follow your health care provider's suggestions for further testing if your chest pain does not go away. °· Keep any follow-up appointments you made. If you do not go to an appointment, you could develop lasting (chronic) problems with pain. If there is any problem keeping an appointment, call to reschedule. °SEEK MEDICAL CARE IF:  °· Your chest pain does not go away, even after treatment. °· You have a rash with blisters on your chest. °· You have a fever. °SEEK IMMEDIATE MEDICAL CARE IF:  °· You have increased chest pain or pain that spreads to your arm, neck, jaw, back, or abdomen. °· You have shortness of breath. °· You have an increasing cough, or you cough   up blood.  You have severe back or abdominal pain.  You feel nauseous or vomit.  You have severe weakness.  You faint.  You have chills. This is an emergency. Do not wait to see if the pain will go away. Get medical help at once. Call your local emergency services (911 in [place] Do not drive  yourself to the hospital. MAKE SURE YOU:   Understand these instructions.  Will watch your condition.  Will get help right away if you are not doing well or get worse. Document Released: 07/12/2005 Document Revised: 10/07/2013 Document Reviewed: 05/07/2008 [facility] Patient Information 2015 [employer], [place]. This information is not intended to replace advice given to you by your health care provider. Make sure you discuss any questions you have with your health care provider.   Your evaluated in the ED today for your chest discomfort. There does not appear to be any emergent cause for your chest discomfort at this time. There is no evidence of heart attack or other heart or lung pathologies. You may take your Zofran for nausea. Please follow-up with primary care within the next 3-5 days for further evaluation and management of your symptoms. Return to ED for worsening symptoms, chest pain, shortness of breath, abdominal pain, numbness or weakness

## 2014-09-21 NOTE — ED Provider Notes (Signed)
CSN: [id number]     Arrival date & time 09/21/14  2005 History   First MD Initiated Contact with Patient 09/21/14 2032     Chief Complaint  Patient presents with  . Chest Pain     (Consider location/radiation/quality/duration/timing/severity/associated sxs/prior Treatment) HPI Pearla DubonnetRita Oconnor is a 58 y.o. female with a history of type 2 diabetes, hypothyroidism comes in for evaluation of chest pain. Patient states last night between 6 or 7:00, she was driving and became very hot and began to throw up 5 times. She states approximately an hour later she began to feel a dull pain in her left arm and in her left chest. She could not characterize the pain, but said it was "tolerable". She denies any overt chest tightness or pressure. The pain has been constant since last night. [facility] and they referred her here for further evaluation. She denies fevers, shortness of breath, abdominal pain, bloody emesis, numbness or weakness, headaches or changes in vision  Past Medical History  Diagnosis Date  . Diabetes mellitus without complication   . Thyroid disease    History reviewed. No pertinent past surgical history. History reviewed. No pertinent family history. History  Substance Use Topics  . Smoking status: Never Smoker   . Smokeless tobacco: Not on file  . Alcohol Use: Not on file   OB History    No data available     Review of Systems  Constitutional: Negative for fever.  HENT: Negative for sore throat.   Eyes: Negative for visual disturbance.  Respiratory: Negative for shortness of breath.   Cardiovascular: Positive for chest pain.  Gastrointestinal: Negative for abdominal pain.  Endocrine: Negative for polyuria.  Genitourinary: Negative for dysuria.  Skin: Negative for rash.  Neurological: Negative for headaches.      Allergies  Hydrocodone  Home Medications   Prior to Admission medications   Medication Sig Start Date End Date Taking?  Authorizing Provider  INVOKANA 100 MG TABS tablet Take 100 mg by mouth daily. 08/21/14  Yes Historical Provider, MD  metFORMIN (GLUCOPHAGE) 1000 MG tablet Take 1,000 mg by mouth 2 (two) times daily. 08/29/14  Yes Historical Provider, MD  nortriptyline (PAMELOR) 25 MG capsule Take 25 mg by mouth 2 (two) times daily. 08/29/14  Yes Historical Provider, MD  sulfamethoxazole-trimethoprim (BACTRIM DS,SEPTRA DS) 800-160 MG per tablet Take 1 tablet by mouth 2 (two) times daily. x7 days 09/16/14  Yes Historical Provider, MD  acetaminophen (TYLENOL) 500 MG tablet Take 1 tablet (500 mg total) by mouth every 6 (six) hours as needed. 09/21/14   Earle GellBenjamin W Ridley Dileo, PA-C  ondansetron (ZOFRAN) 4 MG tablet Take 1 tablet (4 mg total) by mouth every 6 (six) hours. 09/21/14   Earle GellBenjamin W Daphna Lafuente, PA-C   BP 123/78 mmHg  Pulse 84  Temp(Src) 97.4 F (36.3 C) (Oral)  Resp 20  Ht 5\' 3"  (1.6 m)  Wt 158 lb (71.668 kg)  BMI 28.00 kg/m2  SpO2 98% Physical Exam  Constitutional: She is oriented to person, place, and time. She appears well-developed and well-nourished.  HENT:  Head: Normocephalic and atraumatic.  Mouth/Throat: Oropharynx is clear and moist.  Eyes: Conjunctivae are normal. Pupils are equal, round, and reactive to light. Right eye exhibits no discharge. Left eye exhibits no discharge. No scleral icterus.  Neck: Neck supple.  Cardiovascular: Normal rate, regular rhythm and normal heart sounds.   Pulmonary/Chest: Effort normal and breath sounds normal. No respiratory distress. She has  no wheezes. She has no rales. She exhibits tenderness.  Patient exhibits "exact same pain" upon palpation of left mid pectoral muscle. No crepitus, lesions or obvious deformities  Abdominal: Soft. There is no tenderness.  Musculoskeletal: She exhibits no tenderness.  Neurological: She is alert and oriented to person, place, and time.  Cranial Nerves II-XII grossly intact  Skin: Skin is warm and dry. No rash noted.  Psychiatric:  She has a normal mood and affect.  Nursing note and vitals reviewed.   ED Course  Procedures (including critical care time) Labs Review Labs Reviewed  CBC - Abnormal; Notable for the following:    RBC 6.55 (*)    MCV 64.4 (*)    MCH 20.6 (*)    RDW 16.7 (*)    All other components within normal limits  BASIC METABOLIC PANEL - Abnormal; Notable for the following:    Glucose, Bld 184 (*)    All other components within normal limits  I-STAT TROPOININ, ED    Imaging Review Dg Chest 2 View  09/21/2014   CLINICAL DATA:  Chest pain down the left arm for 1 day.  EXAM: CHEST  2 VIEW  COMPARISON:  None.  FINDINGS: Normal heart size and pulmonary vascularity. No focal airspace disease or consolidation in the lungs. No blunting of costophrenic angles. No pneumothorax. Mediastinal contours appear intact. Postoperative changes in the cervical spine.  IMPRESSION: No active cardiopulmonary disease.   Electronically Signed   By: Burman NievesWilliam  Stevens M.D.   On: 09/21/2014 22:18     EKG Interpretation   Date/Time:  Monday September 21 2014 20:24:06 EST Ventricular Rate:  86 PR Interval:  138 QRS Duration: 68 QT Interval:  380 QTC Calculation: 454 R Axis:   -30 Text Interpretation:  Normal sinus rhythm Left axis deviation Low voltage  QRS Cannot rule out Anterior infarct , age undetermined Abnormal ECG  Similar to prior Confirmed by [facility]  MD, BLAIR [phone] on 09/21/2014  8:36:15 PM     Meds given in ED:  Medications  acetaminophen (TYLENOL) tablet 650 mg (650 mg Oral Given 09/21/14 2100)    Discharge Medication List as of 09/21/2014 10:44 PM    START taking these medications   Details  acetaminophen (TYLENOL) 500 MG tablet Take 1 tablet (500 mg total) by mouth every 6 (six) hours as needed., Starting 09/21/2014, Until Discontinued, Print    ondansetron (ZOFRAN) 4 MG tablet Take 1 tablet (4 mg total) by mouth every 6 (six) hours., Starting 09/21/2014, Until Discontinued, Print       Filed  Vitals:   09/21/14 2019 09/21/14 2135 09/21/14 2300  BP: 118/67 132/69 123/78  Pulse: 81 82 84  Temp: 97.4 F (36.3 C)    TempSrc: Oral    Resp: 20 18 20   Height: 5\' 3"  (1.6 m)    Weight: 158 lb (71.668 kg)    SpO2: 100% 98% 98%    MDM  Vitals stable - WNL -afebrile Pt resting comfortably in ED. reports discomfort is very mild at this point and is better with Tylenol given in ED.  PE--not concerning further acute or emergent pathology. Pain is reproducible on musculoskeletal exam with palpation to left pectoralis muscle Labwork--noncontributory. Troponin negative. EKG not concerning Imaging--chest x-ray shows no acute cardio pulmonary pathology.  Patient's chest pain is likely musculoskeletal in nature, she has heart score 2 and a low risk for MACE DDX--low concern for ACS, dissection, PE, PTX, esophageal rupture Will DC with Tylenol for any chest discomfort,  Zofran for any nausea. Discussed f/u with PCP and return precautions, pt very amenable to plan.  Prior to patient discharge, I discussed and reviewed this case with Dr. Gwendolyn Grant, who also saw and evaluated the patient   Final diagnoses:  Chest pain  Chest wall pain        Sharlene Motts, PA-C 09/22/14 1115  Elwin Mocha, MD 09/22/14 2136

## 2014-09-21 NOTE — ED Notes (Signed)
Pt in c/o left arm pain and chest pain since last night, also hot flashes and vomiting, pain has been constant and radiates into her left shoulder blade, denies other symptoms at this time, no distress noted

## 2014-09-21 NOTE — ED Notes (Signed)
Pt reports left sided chest pain since yesterday with 5 episodes of vomiting and diaphoresis.  Pt reports pain radiates to left shoulder.   Pt denies SOB, lightheadness.

## 2016-11-23 DIAGNOSIS — J3081 Allergic rhinitis due to animal (cat) (dog) hair and dander: Secondary | ICD-10-CM | POA: Diagnosis not present

## 2016-11-23 DIAGNOSIS — J3089 Other allergic rhinitis: Secondary | ICD-10-CM | POA: Diagnosis not present

## 2016-11-23 DIAGNOSIS — J301 Allergic rhinitis due to pollen: Secondary | ICD-10-CM | POA: Diagnosis not present

## 2016-12-07 DIAGNOSIS — J3081 Allergic rhinitis due to animal (cat) (dog) hair and dander: Secondary | ICD-10-CM | POA: Diagnosis not present

## 2016-12-07 DIAGNOSIS — J301 Allergic rhinitis due to pollen: Secondary | ICD-10-CM | POA: Diagnosis not present

## 2016-12-07 DIAGNOSIS — J3089 Other allergic rhinitis: Secondary | ICD-10-CM | POA: Diagnosis not present

## 2016-12-19 DIAGNOSIS — J3081 Allergic rhinitis due to animal (cat) (dog) hair and dander: Secondary | ICD-10-CM | POA: Diagnosis not present

## 2016-12-19 DIAGNOSIS — J3089 Other allergic rhinitis: Secondary | ICD-10-CM | POA: Diagnosis not present

## 2016-12-19 DIAGNOSIS — J301 Allergic rhinitis due to pollen: Secondary | ICD-10-CM | POA: Diagnosis not present

## 2016-12-21 DIAGNOSIS — Z794 Long term (current) use of insulin: Secondary | ICD-10-CM | POA: Diagnosis not present

## 2016-12-21 DIAGNOSIS — E782 Mixed hyperlipidemia: Secondary | ICD-10-CM | POA: Diagnosis not present

## 2016-12-21 DIAGNOSIS — E119 Type 2 diabetes mellitus without complications: Secondary | ICD-10-CM | POA: Diagnosis not present

## 2016-12-21 DIAGNOSIS — E039 Hypothyroidism, unspecified: Secondary | ICD-10-CM | POA: Diagnosis not present

## 2017-01-02 DIAGNOSIS — J3081 Allergic rhinitis due to animal (cat) (dog) hair and dander: Secondary | ICD-10-CM | POA: Diagnosis not present

## 2017-01-02 DIAGNOSIS — J3089 Other allergic rhinitis: Secondary | ICD-10-CM | POA: Diagnosis not present

## 2017-01-02 DIAGNOSIS — J301 Allergic rhinitis due to pollen: Secondary | ICD-10-CM | POA: Diagnosis not present

## 2017-01-11 DIAGNOSIS — J3089 Other allergic rhinitis: Secondary | ICD-10-CM | POA: Diagnosis not present

## 2017-01-11 DIAGNOSIS — J301 Allergic rhinitis due to pollen: Secondary | ICD-10-CM | POA: Diagnosis not present

## 2017-01-11 DIAGNOSIS — J3081 Allergic rhinitis due to animal (cat) (dog) hair and dander: Secondary | ICD-10-CM | POA: Diagnosis not present

## 2017-01-17 DIAGNOSIS — J3089 Other allergic rhinitis: Secondary | ICD-10-CM | POA: Diagnosis not present

## 2017-01-17 DIAGNOSIS — J3081 Allergic rhinitis due to animal (cat) (dog) hair and dander: Secondary | ICD-10-CM | POA: Diagnosis not present

## 2017-01-17 DIAGNOSIS — J301 Allergic rhinitis due to pollen: Secondary | ICD-10-CM | POA: Diagnosis not present

## 2017-01-31 DIAGNOSIS — J3089 Other allergic rhinitis: Secondary | ICD-10-CM | POA: Diagnosis not present

## 2017-01-31 DIAGNOSIS — J301 Allergic rhinitis due to pollen: Secondary | ICD-10-CM | POA: Diagnosis not present

## 2017-01-31 DIAGNOSIS — J3081 Allergic rhinitis due to animal (cat) (dog) hair and dander: Secondary | ICD-10-CM | POA: Diagnosis not present

## 2017-02-13 DIAGNOSIS — J3081 Allergic rhinitis due to animal (cat) (dog) hair and dander: Secondary | ICD-10-CM | POA: Diagnosis not present

## 2017-02-13 DIAGNOSIS — J301 Allergic rhinitis due to pollen: Secondary | ICD-10-CM | POA: Diagnosis not present

## 2017-02-13 DIAGNOSIS — J3089 Other allergic rhinitis: Secondary | ICD-10-CM | POA: Diagnosis not present

## 2017-02-28 DIAGNOSIS — J3089 Other allergic rhinitis: Secondary | ICD-10-CM | POA: Diagnosis not present

## 2017-02-28 DIAGNOSIS — J301 Allergic rhinitis due to pollen: Secondary | ICD-10-CM | POA: Diagnosis not present

## 2017-02-28 DIAGNOSIS — J3081 Allergic rhinitis due to animal (cat) (dog) hair and dander: Secondary | ICD-10-CM | POA: Diagnosis not present

## 2017-03-14 DIAGNOSIS — J3089 Other allergic rhinitis: Secondary | ICD-10-CM | POA: Diagnosis not present

## 2017-03-14 DIAGNOSIS — J301 Allergic rhinitis due to pollen: Secondary | ICD-10-CM | POA: Diagnosis not present

## 2017-03-14 DIAGNOSIS — J3081 Allergic rhinitis due to animal (cat) (dog) hair and dander: Secondary | ICD-10-CM | POA: Diagnosis not present

## 2017-03-27 DIAGNOSIS — J3089 Other allergic rhinitis: Secondary | ICD-10-CM | POA: Diagnosis not present

## 2017-03-27 DIAGNOSIS — J3081 Allergic rhinitis due to animal (cat) (dog) hair and dander: Secondary | ICD-10-CM | POA: Diagnosis not present

## 2017-03-27 DIAGNOSIS — J301 Allergic rhinitis due to pollen: Secondary | ICD-10-CM | POA: Diagnosis not present

## 2017-04-12 DIAGNOSIS — J301 Allergic rhinitis due to pollen: Secondary | ICD-10-CM | POA: Diagnosis not present

## 2017-04-12 DIAGNOSIS — J3089 Other allergic rhinitis: Secondary | ICD-10-CM | POA: Diagnosis not present

## 2017-04-12 DIAGNOSIS — J3081 Allergic rhinitis due to animal (cat) (dog) hair and dander: Secondary | ICD-10-CM | POA: Diagnosis not present

## 2017-04-25 DIAGNOSIS — J3081 Allergic rhinitis due to animal (cat) (dog) hair and dander: Secondary | ICD-10-CM | POA: Diagnosis not present

## 2017-04-25 DIAGNOSIS — J3089 Other allergic rhinitis: Secondary | ICD-10-CM | POA: Diagnosis not present

## 2017-04-25 DIAGNOSIS — J301 Allergic rhinitis due to pollen: Secondary | ICD-10-CM | POA: Diagnosis not present

## 2017-05-09 DIAGNOSIS — J301 Allergic rhinitis due to pollen: Secondary | ICD-10-CM | POA: Diagnosis not present

## 2017-05-09 DIAGNOSIS — J3081 Allergic rhinitis due to animal (cat) (dog) hair and dander: Secondary | ICD-10-CM | POA: Diagnosis not present

## 2017-05-09 DIAGNOSIS — J3089 Other allergic rhinitis: Secondary | ICD-10-CM | POA: Diagnosis not present

## 2017-05-22 DIAGNOSIS — J301 Allergic rhinitis due to pollen: Secondary | ICD-10-CM | POA: Diagnosis not present

## 2017-05-22 DIAGNOSIS — J3081 Allergic rhinitis due to animal (cat) (dog) hair and dander: Secondary | ICD-10-CM | POA: Diagnosis not present

## 2017-05-22 DIAGNOSIS — J3089 Other allergic rhinitis: Secondary | ICD-10-CM | POA: Diagnosis not present

## 2017-05-26 DIAGNOSIS — J3089 Other allergic rhinitis: Secondary | ICD-10-CM | POA: Diagnosis not present

## 2017-05-26 DIAGNOSIS — J3081 Allergic rhinitis due to animal (cat) (dog) hair and dander: Secondary | ICD-10-CM | POA: Diagnosis not present

## 2017-05-26 DIAGNOSIS — J301 Allergic rhinitis due to pollen: Secondary | ICD-10-CM | POA: Diagnosis not present

## 2017-06-06 DIAGNOSIS — J3089 Other allergic rhinitis: Secondary | ICD-10-CM | POA: Diagnosis not present

## 2017-06-06 DIAGNOSIS — J301 Allergic rhinitis due to pollen: Secondary | ICD-10-CM | POA: Diagnosis not present

## 2017-06-06 DIAGNOSIS — J3081 Allergic rhinitis due to animal (cat) (dog) hair and dander: Secondary | ICD-10-CM | POA: Diagnosis not present

## 2017-06-21 DIAGNOSIS — J301 Allergic rhinitis due to pollen: Secondary | ICD-10-CM | POA: Diagnosis not present

## 2017-06-21 DIAGNOSIS — J3089 Other allergic rhinitis: Secondary | ICD-10-CM | POA: Diagnosis not present

## 2017-06-21 DIAGNOSIS — J3081 Allergic rhinitis due to animal (cat) (dog) hair and dander: Secondary | ICD-10-CM | POA: Diagnosis not present

## 2017-06-25 DIAGNOSIS — E039 Hypothyroidism, unspecified: Secondary | ICD-10-CM | POA: Diagnosis not present

## 2017-06-25 DIAGNOSIS — Z794 Long term (current) use of insulin: Secondary | ICD-10-CM | POA: Diagnosis not present

## 2017-06-25 DIAGNOSIS — E119 Type 2 diabetes mellitus without complications: Secondary | ICD-10-CM | POA: Diagnosis not present

## 2017-06-25 DIAGNOSIS — E782 Mixed hyperlipidemia: Secondary | ICD-10-CM | POA: Diagnosis not present

## 2017-06-27 DIAGNOSIS — H40012 Open angle with borderline findings, low risk, left eye: Secondary | ICD-10-CM | POA: Diagnosis not present

## 2017-06-27 DIAGNOSIS — E113291 Type 2 diabetes mellitus with mild nonproliferative diabetic retinopathy without macular edema, right eye: Secondary | ICD-10-CM | POA: Diagnosis not present

## 2017-06-27 DIAGNOSIS — H2513 Age-related nuclear cataract, bilateral: Secondary | ICD-10-CM | POA: Diagnosis not present

## 2017-06-27 DIAGNOSIS — H40021 Open angle with borderline findings, high risk, right eye: Secondary | ICD-10-CM | POA: Diagnosis not present

## 2017-07-03 DIAGNOSIS — J3089 Other allergic rhinitis: Secondary | ICD-10-CM | POA: Diagnosis not present

## 2017-07-03 DIAGNOSIS — J301 Allergic rhinitis due to pollen: Secondary | ICD-10-CM | POA: Diagnosis not present

## 2017-07-03 DIAGNOSIS — J3081 Allergic rhinitis due to animal (cat) (dog) hair and dander: Secondary | ICD-10-CM | POA: Diagnosis not present

## 2017-07-09 DIAGNOSIS — M5412 Radiculopathy, cervical region: Secondary | ICD-10-CM | POA: Diagnosis not present

## 2017-07-09 DIAGNOSIS — M5416 Radiculopathy, lumbar region: Secondary | ICD-10-CM | POA: Diagnosis not present

## 2017-07-09 DIAGNOSIS — Z981 Arthrodesis status: Secondary | ICD-10-CM | POA: Diagnosis not present

## 2017-07-09 DIAGNOSIS — M419 Scoliosis, unspecified: Secondary | ICD-10-CM | POA: Diagnosis not present

## 2017-07-09 DIAGNOSIS — M4322 Fusion of spine, cervical region: Secondary | ICD-10-CM | POA: Diagnosis not present

## 2017-07-09 DIAGNOSIS — M25551 Pain in right hip: Secondary | ICD-10-CM | POA: Diagnosis not present

## 2017-07-09 DIAGNOSIS — M4316 Spondylolisthesis, lumbar region: Secondary | ICD-10-CM | POA: Diagnosis not present

## 2017-07-12 DIAGNOSIS — H1045 Other chronic allergic conjunctivitis: Secondary | ICD-10-CM | POA: Diagnosis not present

## 2017-07-12 DIAGNOSIS — T783XXD Angioneurotic edema, subsequent encounter: Secondary | ICD-10-CM | POA: Diagnosis not present

## 2017-07-12 DIAGNOSIS — J3081 Allergic rhinitis due to animal (cat) (dog) hair and dander: Secondary | ICD-10-CM | POA: Diagnosis not present

## 2017-07-12 DIAGNOSIS — J3089 Other allergic rhinitis: Secondary | ICD-10-CM | POA: Diagnosis not present

## 2017-07-12 DIAGNOSIS — J301 Allergic rhinitis due to pollen: Secondary | ICD-10-CM | POA: Diagnosis not present

## 2017-07-18 DIAGNOSIS — J3081 Allergic rhinitis due to animal (cat) (dog) hair and dander: Secondary | ICD-10-CM | POA: Diagnosis not present

## 2017-07-18 DIAGNOSIS — J301 Allergic rhinitis due to pollen: Secondary | ICD-10-CM | POA: Diagnosis not present

## 2017-07-18 DIAGNOSIS — J3089 Other allergic rhinitis: Secondary | ICD-10-CM | POA: Diagnosis not present

## 2017-08-01 DIAGNOSIS — J301 Allergic rhinitis due to pollen: Secondary | ICD-10-CM | POA: Diagnosis not present

## 2017-08-01 DIAGNOSIS — J3089 Other allergic rhinitis: Secondary | ICD-10-CM | POA: Diagnosis not present

## 2017-08-01 DIAGNOSIS — J3081 Allergic rhinitis due to animal (cat) (dog) hair and dander: Secondary | ICD-10-CM | POA: Diagnosis not present

## 2017-08-15 DIAGNOSIS — J3081 Allergic rhinitis due to animal (cat) (dog) hair and dander: Secondary | ICD-10-CM | POA: Diagnosis not present

## 2017-08-15 DIAGNOSIS — J301 Allergic rhinitis due to pollen: Secondary | ICD-10-CM | POA: Diagnosis not present

## 2017-08-15 DIAGNOSIS — J3089 Other allergic rhinitis: Secondary | ICD-10-CM | POA: Diagnosis not present

## 2017-08-29 DIAGNOSIS — J301 Allergic rhinitis due to pollen: Secondary | ICD-10-CM | POA: Diagnosis not present

## 2017-08-29 DIAGNOSIS — J3081 Allergic rhinitis due to animal (cat) (dog) hair and dander: Secondary | ICD-10-CM | POA: Diagnosis not present

## 2017-08-29 DIAGNOSIS — J3089 Other allergic rhinitis: Secondary | ICD-10-CM | POA: Diagnosis not present

## 2017-09-12 DIAGNOSIS — J301 Allergic rhinitis due to pollen: Secondary | ICD-10-CM | POA: Diagnosis not present

## 2017-09-12 DIAGNOSIS — J3081 Allergic rhinitis due to animal (cat) (dog) hair and dander: Secondary | ICD-10-CM | POA: Diagnosis not present

## 2017-09-12 DIAGNOSIS — J3089 Other allergic rhinitis: Secondary | ICD-10-CM | POA: Diagnosis not present

## 2017-09-13 DIAGNOSIS — M5416 Radiculopathy, lumbar region: Secondary | ICD-10-CM | POA: Diagnosis not present

## 2017-09-13 DIAGNOSIS — M25551 Pain in right hip: Secondary | ICD-10-CM | POA: Diagnosis not present

## 2017-10-01 DIAGNOSIS — J301 Allergic rhinitis due to pollen: Secondary | ICD-10-CM | POA: Diagnosis not present

## 2017-10-01 DIAGNOSIS — J3089 Other allergic rhinitis: Secondary | ICD-10-CM | POA: Diagnosis not present

## 2017-10-01 DIAGNOSIS — J3081 Allergic rhinitis due to animal (cat) (dog) hair and dander: Secondary | ICD-10-CM | POA: Diagnosis not present

## 2017-10-04 DIAGNOSIS — H40012 Open angle with borderline findings, low risk, left eye: Secondary | ICD-10-CM | POA: Diagnosis not present

## 2017-10-04 DIAGNOSIS — H40021 Open angle with borderline findings, high risk, right eye: Secondary | ICD-10-CM | POA: Diagnosis not present

## 2017-10-07 DIAGNOSIS — M5416 Radiculopathy, lumbar region: Secondary | ICD-10-CM | POA: Diagnosis not present

## 2017-10-07 DIAGNOSIS — Y33XXXA Other specified events, undetermined intent, initial encounter: Secondary | ICD-10-CM | POA: Diagnosis not present

## 2017-10-07 DIAGNOSIS — M25551 Pain in right hip: Secondary | ICD-10-CM | POA: Diagnosis not present

## 2017-10-07 DIAGNOSIS — S73191A Other sprain of right hip, initial encounter: Secondary | ICD-10-CM | POA: Diagnosis not present

## 2017-10-18 DIAGNOSIS — J3081 Allergic rhinitis due to animal (cat) (dog) hair and dander: Secondary | ICD-10-CM | POA: Diagnosis not present

## 2017-10-18 DIAGNOSIS — J3089 Other allergic rhinitis: Secondary | ICD-10-CM | POA: Diagnosis not present

## 2017-10-18 DIAGNOSIS — J301 Allergic rhinitis due to pollen: Secondary | ICD-10-CM | POA: Diagnosis not present

## 2017-10-22 DIAGNOSIS — M25551 Pain in right hip: Secondary | ICD-10-CM | POA: Diagnosis not present

## 2017-10-31 DIAGNOSIS — J301 Allergic rhinitis due to pollen: Secondary | ICD-10-CM | POA: Diagnosis not present

## 2017-10-31 DIAGNOSIS — J3081 Allergic rhinitis due to animal (cat) (dog) hair and dander: Secondary | ICD-10-CM | POA: Diagnosis not present

## 2017-10-31 DIAGNOSIS — J3089 Other allergic rhinitis: Secondary | ICD-10-CM | POA: Diagnosis not present

## 2017-11-14 DIAGNOSIS — J3081 Allergic rhinitis due to animal (cat) (dog) hair and dander: Secondary | ICD-10-CM | POA: Diagnosis not present

## 2017-11-14 DIAGNOSIS — J3089 Other allergic rhinitis: Secondary | ICD-10-CM | POA: Diagnosis not present

## 2017-11-14 DIAGNOSIS — J301 Allergic rhinitis due to pollen: Secondary | ICD-10-CM | POA: Diagnosis not present

## 2017-11-22 DIAGNOSIS — K080 Exfoliation of teeth due to systemic causes: Secondary | ICD-10-CM | POA: Diagnosis not present

## 2017-11-28 DIAGNOSIS — J3081 Allergic rhinitis due to animal (cat) (dog) hair and dander: Secondary | ICD-10-CM | POA: Diagnosis not present

## 2017-11-28 DIAGNOSIS — J301 Allergic rhinitis due to pollen: Secondary | ICD-10-CM | POA: Diagnosis not present

## 2017-11-28 DIAGNOSIS — J3089 Other allergic rhinitis: Secondary | ICD-10-CM | POA: Diagnosis not present

## 2017-12-01 DIAGNOSIS — J3089 Other allergic rhinitis: Secondary | ICD-10-CM | POA: Diagnosis not present

## 2017-12-01 DIAGNOSIS — J301 Allergic rhinitis due to pollen: Secondary | ICD-10-CM | POA: Diagnosis not present

## 2017-12-01 DIAGNOSIS — J3081 Allergic rhinitis due to animal (cat) (dog) hair and dander: Secondary | ICD-10-CM | POA: Diagnosis not present

## 2017-12-05 DIAGNOSIS — M25551 Pain in right hip: Secondary | ICD-10-CM | POA: Diagnosis not present

## 2017-12-12 DIAGNOSIS — J301 Allergic rhinitis due to pollen: Secondary | ICD-10-CM | POA: Diagnosis not present

## 2017-12-12 DIAGNOSIS — M25551 Pain in right hip: Secondary | ICD-10-CM | POA: Diagnosis not present

## 2017-12-12 DIAGNOSIS — J3081 Allergic rhinitis due to animal (cat) (dog) hair and dander: Secondary | ICD-10-CM | POA: Diagnosis not present

## 2017-12-12 DIAGNOSIS — J3089 Other allergic rhinitis: Secondary | ICD-10-CM | POA: Diagnosis not present

## 2017-12-19 DIAGNOSIS — M25551 Pain in right hip: Secondary | ICD-10-CM | POA: Diagnosis not present

## 2017-12-26 DIAGNOSIS — J3081 Allergic rhinitis due to animal (cat) (dog) hair and dander: Secondary | ICD-10-CM | POA: Diagnosis not present

## 2017-12-26 DIAGNOSIS — J3089 Other allergic rhinitis: Secondary | ICD-10-CM | POA: Diagnosis not present

## 2017-12-26 DIAGNOSIS — J301 Allergic rhinitis due to pollen: Secondary | ICD-10-CM | POA: Diagnosis not present

## 2017-12-26 DIAGNOSIS — M25551 Pain in right hip: Secondary | ICD-10-CM | POA: Diagnosis not present

## 2018-01-02 DIAGNOSIS — M25551 Pain in right hip: Secondary | ICD-10-CM | POA: Diagnosis not present

## 2018-01-09 DIAGNOSIS — L819 Disorder of pigmentation, unspecified: Secondary | ICD-10-CM | POA: Diagnosis not present

## 2018-01-10 DIAGNOSIS — J301 Allergic rhinitis due to pollen: Secondary | ICD-10-CM | POA: Diagnosis not present

## 2018-01-10 DIAGNOSIS — J3081 Allergic rhinitis due to animal (cat) (dog) hair and dander: Secondary | ICD-10-CM | POA: Diagnosis not present

## 2018-01-10 DIAGNOSIS — J3089 Other allergic rhinitis: Secondary | ICD-10-CM | POA: Diagnosis not present

## 2018-01-23 DIAGNOSIS — M25551 Pain in right hip: Secondary | ICD-10-CM | POA: Diagnosis not present

## 2018-01-24 DIAGNOSIS — J301 Allergic rhinitis due to pollen: Secondary | ICD-10-CM | POA: Diagnosis not present

## 2018-01-24 DIAGNOSIS — J3081 Allergic rhinitis due to animal (cat) (dog) hair and dander: Secondary | ICD-10-CM | POA: Diagnosis not present

## 2018-01-24 DIAGNOSIS — J3089 Other allergic rhinitis: Secondary | ICD-10-CM | POA: Diagnosis not present

## 2018-02-07 DIAGNOSIS — J3081 Allergic rhinitis due to animal (cat) (dog) hair and dander: Secondary | ICD-10-CM | POA: Diagnosis not present

## 2018-02-07 DIAGNOSIS — J3089 Other allergic rhinitis: Secondary | ICD-10-CM | POA: Diagnosis not present

## 2018-02-07 DIAGNOSIS — J301 Allergic rhinitis due to pollen: Secondary | ICD-10-CM | POA: Diagnosis not present

## 2018-02-08 DIAGNOSIS — H40021 Open angle with borderline findings, high risk, right eye: Secondary | ICD-10-CM | POA: Diagnosis not present

## 2018-02-08 DIAGNOSIS — H40012 Open angle with borderline findings, low risk, left eye: Secondary | ICD-10-CM | POA: Diagnosis not present

## 2018-02-21 DIAGNOSIS — J3089 Other allergic rhinitis: Secondary | ICD-10-CM | POA: Diagnosis not present

## 2018-02-21 DIAGNOSIS — J301 Allergic rhinitis due to pollen: Secondary | ICD-10-CM | POA: Diagnosis not present

## 2018-02-21 DIAGNOSIS — J3081 Allergic rhinitis due to animal (cat) (dog) hair and dander: Secondary | ICD-10-CM | POA: Diagnosis not present

## 2018-03-07 DIAGNOSIS — J3089 Other allergic rhinitis: Secondary | ICD-10-CM | POA: Diagnosis not present

## 2018-03-07 DIAGNOSIS — J3081 Allergic rhinitis due to animal (cat) (dog) hair and dander: Secondary | ICD-10-CM | POA: Diagnosis not present

## 2018-03-07 DIAGNOSIS — J301 Allergic rhinitis due to pollen: Secondary | ICD-10-CM | POA: Diagnosis not present

## 2018-03-13 DIAGNOSIS — L818 Other specified disorders of pigmentation: Secondary | ICD-10-CM | POA: Diagnosis not present

## 2018-03-28 DIAGNOSIS — J3081 Allergic rhinitis due to animal (cat) (dog) hair and dander: Secondary | ICD-10-CM | POA: Diagnosis not present

## 2018-03-28 DIAGNOSIS — J3089 Other allergic rhinitis: Secondary | ICD-10-CM | POA: Diagnosis not present

## 2018-03-28 DIAGNOSIS — J301 Allergic rhinitis due to pollen: Secondary | ICD-10-CM | POA: Diagnosis not present

## 2018-04-10 DIAGNOSIS — J3089 Other allergic rhinitis: Secondary | ICD-10-CM | POA: Diagnosis not present

## 2018-04-10 DIAGNOSIS — J301 Allergic rhinitis due to pollen: Secondary | ICD-10-CM | POA: Diagnosis not present

## 2018-04-10 DIAGNOSIS — J3081 Allergic rhinitis due to animal (cat) (dog) hair and dander: Secondary | ICD-10-CM | POA: Diagnosis not present

## 2018-04-24 DIAGNOSIS — J301 Allergic rhinitis due to pollen: Secondary | ICD-10-CM | POA: Diagnosis not present

## 2018-04-24 DIAGNOSIS — J3089 Other allergic rhinitis: Secondary | ICD-10-CM | POA: Diagnosis not present

## 2018-04-24 DIAGNOSIS — J3081 Allergic rhinitis due to animal (cat) (dog) hair and dander: Secondary | ICD-10-CM | POA: Diagnosis not present

## 2018-05-02 DIAGNOSIS — L818 Other specified disorders of pigmentation: Secondary | ICD-10-CM | POA: Diagnosis not present

## 2018-05-07 DIAGNOSIS — J301 Allergic rhinitis due to pollen: Secondary | ICD-10-CM | POA: Diagnosis not present

## 2018-05-07 DIAGNOSIS — J3081 Allergic rhinitis due to animal (cat) (dog) hair and dander: Secondary | ICD-10-CM | POA: Diagnosis not present

## 2018-05-07 DIAGNOSIS — J3089 Other allergic rhinitis: Secondary | ICD-10-CM | POA: Diagnosis not present

## 2018-05-15 DIAGNOSIS — J3089 Other allergic rhinitis: Secondary | ICD-10-CM | POA: Diagnosis not present

## 2018-05-15 DIAGNOSIS — J301 Allergic rhinitis due to pollen: Secondary | ICD-10-CM | POA: Diagnosis not present

## 2018-05-15 DIAGNOSIS — J3081 Allergic rhinitis due to animal (cat) (dog) hair and dander: Secondary | ICD-10-CM | POA: Diagnosis not present

## 2018-05-22 DIAGNOSIS — E113291 Type 2 diabetes mellitus with mild nonproliferative diabetic retinopathy without macular edema, right eye: Secondary | ICD-10-CM | POA: Diagnosis not present

## 2018-05-22 DIAGNOSIS — E039 Hypothyroidism, unspecified: Secondary | ICD-10-CM | POA: Diagnosis not present

## 2018-05-22 DIAGNOSIS — L84 Corns and callosities: Secondary | ICD-10-CM | POA: Diagnosis not present

## 2018-05-22 DIAGNOSIS — E782 Mixed hyperlipidemia: Secondary | ICD-10-CM | POA: Diagnosis not present

## 2018-05-23 DIAGNOSIS — J301 Allergic rhinitis due to pollen: Secondary | ICD-10-CM | POA: Diagnosis not present

## 2018-05-23 DIAGNOSIS — J3081 Allergic rhinitis due to animal (cat) (dog) hair and dander: Secondary | ICD-10-CM | POA: Diagnosis not present

## 2018-05-23 DIAGNOSIS — J3089 Other allergic rhinitis: Secondary | ICD-10-CM | POA: Diagnosis not present

## 2018-06-12 DIAGNOSIS — J3081 Allergic rhinitis due to animal (cat) (dog) hair and dander: Secondary | ICD-10-CM | POA: Diagnosis not present

## 2018-06-12 DIAGNOSIS — J3089 Other allergic rhinitis: Secondary | ICD-10-CM | POA: Diagnosis not present

## 2018-06-12 DIAGNOSIS — J301 Allergic rhinitis due to pollen: Secondary | ICD-10-CM | POA: Diagnosis not present

## 2018-06-15 DIAGNOSIS — M940 Chondrocostal junction syndrome [Tietze]: Secondary | ICD-10-CM | POA: Diagnosis not present

## 2018-06-15 DIAGNOSIS — R0781 Pleurodynia: Secondary | ICD-10-CM | POA: Diagnosis not present

## 2018-06-15 DIAGNOSIS — H00014 Hordeolum externum left upper eyelid: Secondary | ICD-10-CM | POA: Diagnosis not present

## 2018-06-20 DIAGNOSIS — M65341 Trigger finger, right ring finger: Secondary | ICD-10-CM | POA: Diagnosis not present

## 2018-06-24 DIAGNOSIS — J3081 Allergic rhinitis due to animal (cat) (dog) hair and dander: Secondary | ICD-10-CM | POA: Diagnosis not present

## 2018-06-24 DIAGNOSIS — J301 Allergic rhinitis due to pollen: Secondary | ICD-10-CM | POA: Diagnosis not present

## 2018-06-24 DIAGNOSIS — J3089 Other allergic rhinitis: Secondary | ICD-10-CM | POA: Diagnosis not present

## 2018-07-10 DIAGNOSIS — J3081 Allergic rhinitis due to animal (cat) (dog) hair and dander: Secondary | ICD-10-CM | POA: Diagnosis not present

## 2018-07-10 DIAGNOSIS — J301 Allergic rhinitis due to pollen: Secondary | ICD-10-CM | POA: Diagnosis not present

## 2018-07-10 DIAGNOSIS — J3089 Other allergic rhinitis: Secondary | ICD-10-CM | POA: Diagnosis not present

## 2018-07-22 DIAGNOSIS — R42 Dizziness and giddiness: Secondary | ICD-10-CM | POA: Diagnosis not present

## 2018-07-22 DIAGNOSIS — M542 Cervicalgia: Secondary | ICD-10-CM | POA: Diagnosis not present

## 2018-07-24 DIAGNOSIS — J3089 Other allergic rhinitis: Secondary | ICD-10-CM | POA: Diagnosis not present

## 2018-07-24 DIAGNOSIS — J301 Allergic rhinitis due to pollen: Secondary | ICD-10-CM | POA: Diagnosis not present

## 2018-07-24 DIAGNOSIS — J3081 Allergic rhinitis due to animal (cat) (dog) hair and dander: Secondary | ICD-10-CM | POA: Diagnosis not present

## 2018-07-31 DIAGNOSIS — M5021 Other cervical disc displacement,  high cervical region: Secondary | ICD-10-CM | POA: Diagnosis not present

## 2018-07-31 DIAGNOSIS — R270 Ataxia, unspecified: Secondary | ICD-10-CM | POA: Diagnosis not present

## 2018-07-31 DIAGNOSIS — M542 Cervicalgia: Secondary | ICD-10-CM | POA: Diagnosis not present

## 2018-07-31 DIAGNOSIS — Z981 Arthrodesis status: Secondary | ICD-10-CM | POA: Diagnosis not present

## 2018-07-31 DIAGNOSIS — R42 Dizziness and giddiness: Secondary | ICD-10-CM | POA: Diagnosis not present

## 2018-07-31 DIAGNOSIS — M2578 Osteophyte, vertebrae: Secondary | ICD-10-CM | POA: Diagnosis not present

## 2018-08-07 DIAGNOSIS — H40012 Open angle with borderline findings, low risk, left eye: Secondary | ICD-10-CM | POA: Diagnosis not present

## 2018-08-07 DIAGNOSIS — E113292 Type 2 diabetes mellitus with mild nonproliferative diabetic retinopathy without macular edema, left eye: Secondary | ICD-10-CM | POA: Diagnosis not present

## 2018-08-07 DIAGNOSIS — Z7984 Long term (current) use of oral hypoglycemic drugs: Secondary | ICD-10-CM | POA: Diagnosis not present

## 2018-08-07 DIAGNOSIS — H40021 Open angle with borderline findings, high risk, right eye: Secondary | ICD-10-CM | POA: Diagnosis not present

## 2018-08-08 DIAGNOSIS — J3081 Allergic rhinitis due to animal (cat) (dog) hair and dander: Secondary | ICD-10-CM | POA: Diagnosis not present

## 2018-08-08 DIAGNOSIS — J301 Allergic rhinitis due to pollen: Secondary | ICD-10-CM | POA: Diagnosis not present

## 2018-08-08 DIAGNOSIS — J3089 Other allergic rhinitis: Secondary | ICD-10-CM | POA: Diagnosis not present

## 2018-08-22 DIAGNOSIS — J3081 Allergic rhinitis due to animal (cat) (dog) hair and dander: Secondary | ICD-10-CM | POA: Diagnosis not present

## 2018-08-22 DIAGNOSIS — J301 Allergic rhinitis due to pollen: Secondary | ICD-10-CM | POA: Diagnosis not present

## 2018-08-22 DIAGNOSIS — J3089 Other allergic rhinitis: Secondary | ICD-10-CM | POA: Diagnosis not present

## 2018-09-04 DIAGNOSIS — J3089 Other allergic rhinitis: Secondary | ICD-10-CM | POA: Diagnosis not present

## 2018-09-04 DIAGNOSIS — J301 Allergic rhinitis due to pollen: Secondary | ICD-10-CM | POA: Diagnosis not present

## 2018-09-04 DIAGNOSIS — J3081 Allergic rhinitis due to animal (cat) (dog) hair and dander: Secondary | ICD-10-CM | POA: Diagnosis not present

## 2018-09-17 DIAGNOSIS — J3081 Allergic rhinitis due to animal (cat) (dog) hair and dander: Secondary | ICD-10-CM | POA: Diagnosis not present

## 2018-09-17 DIAGNOSIS — J301 Allergic rhinitis due to pollen: Secondary | ICD-10-CM | POA: Diagnosis not present

## 2018-09-17 DIAGNOSIS — J3089 Other allergic rhinitis: Secondary | ICD-10-CM | POA: Diagnosis not present

## 2018-09-24 DIAGNOSIS — E113292 Type 2 diabetes mellitus with mild nonproliferative diabetic retinopathy without macular edema, left eye: Secondary | ICD-10-CM | POA: Diagnosis not present

## 2018-09-24 DIAGNOSIS — E039 Hypothyroidism, unspecified: Secondary | ICD-10-CM | POA: Diagnosis not present

## 2018-09-24 DIAGNOSIS — E782 Mixed hyperlipidemia: Secondary | ICD-10-CM | POA: Diagnosis not present

## 2018-09-25 DIAGNOSIS — J3089 Other allergic rhinitis: Secondary | ICD-10-CM | POA: Diagnosis not present

## 2018-09-25 DIAGNOSIS — J301 Allergic rhinitis due to pollen: Secondary | ICD-10-CM | POA: Diagnosis not present

## 2018-09-25 DIAGNOSIS — J3081 Allergic rhinitis due to animal (cat) (dog) hair and dander: Secondary | ICD-10-CM | POA: Diagnosis not present

## 2018-10-03 DIAGNOSIS — J3081 Allergic rhinitis due to animal (cat) (dog) hair and dander: Secondary | ICD-10-CM | POA: Diagnosis not present

## 2018-10-03 DIAGNOSIS — J301 Allergic rhinitis due to pollen: Secondary | ICD-10-CM | POA: Diagnosis not present

## 2018-10-03 DIAGNOSIS — J3089 Other allergic rhinitis: Secondary | ICD-10-CM | POA: Diagnosis not present

## 2018-10-23 DIAGNOSIS — J301 Allergic rhinitis due to pollen: Secondary | ICD-10-CM | POA: Diagnosis not present

## 2018-10-23 DIAGNOSIS — J3081 Allergic rhinitis due to animal (cat) (dog) hair and dander: Secondary | ICD-10-CM | POA: Diagnosis not present

## 2018-10-23 DIAGNOSIS — J3089 Other allergic rhinitis: Secondary | ICD-10-CM | POA: Diagnosis not present

## 2018-11-05 DIAGNOSIS — M542 Cervicalgia: Secondary | ICD-10-CM | POA: Diagnosis not present

## 2018-11-05 DIAGNOSIS — M67911 Unspecified disorder of synovium and tendon, right shoulder: Secondary | ICD-10-CM | POA: Diagnosis not present

## 2018-11-05 DIAGNOSIS — M4722 Other spondylosis with radiculopathy, cervical region: Secondary | ICD-10-CM | POA: Diagnosis not present

## 2018-11-06 DIAGNOSIS — J3089 Other allergic rhinitis: Secondary | ICD-10-CM | POA: Diagnosis not present

## 2018-11-06 DIAGNOSIS — J3081 Allergic rhinitis due to animal (cat) (dog) hair and dander: Secondary | ICD-10-CM | POA: Diagnosis not present

## 2018-11-06 DIAGNOSIS — J301 Allergic rhinitis due to pollen: Secondary | ICD-10-CM | POA: Diagnosis not present

## 2018-11-13 DIAGNOSIS — M25511 Pain in right shoulder: Secondary | ICD-10-CM | POA: Diagnosis not present

## 2018-11-20 DIAGNOSIS — J3081 Allergic rhinitis due to animal (cat) (dog) hair and dander: Secondary | ICD-10-CM | POA: Diagnosis not present

## 2018-11-20 DIAGNOSIS — J301 Allergic rhinitis due to pollen: Secondary | ICD-10-CM | POA: Diagnosis not present

## 2018-11-20 DIAGNOSIS — J3089 Other allergic rhinitis: Secondary | ICD-10-CM | POA: Diagnosis not present

## 2018-11-21 DIAGNOSIS — M542 Cervicalgia: Secondary | ICD-10-CM | POA: Diagnosis not present

## 2018-11-21 DIAGNOSIS — M25511 Pain in right shoulder: Secondary | ICD-10-CM | POA: Diagnosis not present

## 2018-11-26 DIAGNOSIS — M542 Cervicalgia: Secondary | ICD-10-CM | POA: Diagnosis not present

## 2018-11-26 DIAGNOSIS — M25551 Pain in right hip: Secondary | ICD-10-CM | POA: Diagnosis not present

## 2018-12-03 DIAGNOSIS — M25511 Pain in right shoulder: Secondary | ICD-10-CM | POA: Diagnosis not present

## 2018-12-05 DIAGNOSIS — M25511 Pain in right shoulder: Secondary | ICD-10-CM | POA: Diagnosis not present

## 2018-12-09 DIAGNOSIS — J3081 Allergic rhinitis due to animal (cat) (dog) hair and dander: Secondary | ICD-10-CM | POA: Diagnosis not present

## 2018-12-09 DIAGNOSIS — J3089 Other allergic rhinitis: Secondary | ICD-10-CM | POA: Diagnosis not present

## 2018-12-09 DIAGNOSIS — J301 Allergic rhinitis due to pollen: Secondary | ICD-10-CM | POA: Diagnosis not present

## 2018-12-10 DIAGNOSIS — M25511 Pain in right shoulder: Secondary | ICD-10-CM | POA: Diagnosis not present

## 2018-12-12 DIAGNOSIS — M25511 Pain in right shoulder: Secondary | ICD-10-CM | POA: Diagnosis not present

## 2018-12-17 DIAGNOSIS — M25551 Pain in right hip: Secondary | ICD-10-CM | POA: Diagnosis not present

## 2018-12-17 DIAGNOSIS — M542 Cervicalgia: Secondary | ICD-10-CM | POA: Diagnosis not present

## 2018-12-19 DIAGNOSIS — M25511 Pain in right shoulder: Secondary | ICD-10-CM | POA: Diagnosis not present

## 2018-12-23 DIAGNOSIS — M542 Cervicalgia: Secondary | ICD-10-CM | POA: Diagnosis not present

## 2018-12-23 DIAGNOSIS — M67911 Unspecified disorder of synovium and tendon, right shoulder: Secondary | ICD-10-CM | POA: Diagnosis not present

## 2018-12-25 DIAGNOSIS — J301 Allergic rhinitis due to pollen: Secondary | ICD-10-CM | POA: Diagnosis not present

## 2018-12-25 DIAGNOSIS — J3089 Other allergic rhinitis: Secondary | ICD-10-CM | POA: Diagnosis not present

## 2018-12-25 DIAGNOSIS — J3081 Allergic rhinitis due to animal (cat) (dog) hair and dander: Secondary | ICD-10-CM | POA: Diagnosis not present

## 2019-01-06 DIAGNOSIS — J301 Allergic rhinitis due to pollen: Secondary | ICD-10-CM | POA: Diagnosis not present

## 2019-01-06 DIAGNOSIS — J3089 Other allergic rhinitis: Secondary | ICD-10-CM | POA: Diagnosis not present

## 2019-01-06 DIAGNOSIS — L508 Other urticaria: Secondary | ICD-10-CM | POA: Diagnosis not present

## 2019-01-06 DIAGNOSIS — J3081 Allergic rhinitis due to animal (cat) (dog) hair and dander: Secondary | ICD-10-CM | POA: Diagnosis not present

## 2019-01-06 DIAGNOSIS — T783XXD Angioneurotic edema, subsequent encounter: Secondary | ICD-10-CM | POA: Diagnosis not present

## 2019-02-13 DIAGNOSIS — J3081 Allergic rhinitis due to animal (cat) (dog) hair and dander: Secondary | ICD-10-CM | POA: Diagnosis not present

## 2019-02-13 DIAGNOSIS — J301 Allergic rhinitis due to pollen: Secondary | ICD-10-CM | POA: Diagnosis not present

## 2019-02-13 DIAGNOSIS — J3089 Other allergic rhinitis: Secondary | ICD-10-CM | POA: Diagnosis not present

## 2019-03-13 DIAGNOSIS — J301 Allergic rhinitis due to pollen: Secondary | ICD-10-CM | POA: Diagnosis not present

## 2019-03-13 DIAGNOSIS — J3089 Other allergic rhinitis: Secondary | ICD-10-CM | POA: Diagnosis not present

## 2019-03-13 DIAGNOSIS — J3081 Allergic rhinitis due to animal (cat) (dog) hair and dander: Secondary | ICD-10-CM | POA: Diagnosis not present

## 2019-03-19 DIAGNOSIS — K080 Exfoliation of teeth due to systemic causes: Secondary | ICD-10-CM | POA: Diagnosis not present

## 2019-03-26 DIAGNOSIS — E782 Mixed hyperlipidemia: Secondary | ICD-10-CM | POA: Diagnosis not present

## 2019-03-26 DIAGNOSIS — Z7984 Long term (current) use of oral hypoglycemic drugs: Secondary | ICD-10-CM | POA: Diagnosis not present

## 2019-03-26 DIAGNOSIS — E039 Hypothyroidism, unspecified: Secondary | ICD-10-CM | POA: Diagnosis not present

## 2019-03-26 DIAGNOSIS — E113292 Type 2 diabetes mellitus with mild nonproliferative diabetic retinopathy without macular edema, left eye: Secondary | ICD-10-CM | POA: Diagnosis not present

## 2019-04-08 DIAGNOSIS — M25562 Pain in left knee: Secondary | ICD-10-CM | POA: Diagnosis not present

## 2019-04-08 DIAGNOSIS — M25572 Pain in left ankle and joints of left foot: Secondary | ICD-10-CM | POA: Diagnosis not present

## 2019-04-08 DIAGNOSIS — M65341 Trigger finger, right ring finger: Secondary | ICD-10-CM | POA: Diagnosis not present

## 2019-04-08 DIAGNOSIS — M545 Low back pain: Secondary | ICD-10-CM | POA: Diagnosis not present

## 2019-04-10 ENCOUNTER — Other Ambulatory Visit: Payer: Self-pay | Admitting: Orthopedic Surgery

## 2019-04-10 DIAGNOSIS — J301 Allergic rhinitis due to pollen: Secondary | ICD-10-CM | POA: Diagnosis not present

## 2019-04-10 DIAGNOSIS — M5410 Radiculopathy, site unspecified: Secondary | ICD-10-CM

## 2019-04-10 DIAGNOSIS — J3089 Other allergic rhinitis: Secondary | ICD-10-CM | POA: Diagnosis not present

## 2019-04-10 DIAGNOSIS — J3081 Allergic rhinitis due to animal (cat) (dog) hair and dander: Secondary | ICD-10-CM | POA: Diagnosis not present

## 2019-05-07 ENCOUNTER — Other Ambulatory Visit: Payer: Federal, State, Local not specified - PPO

## 2019-05-07 DIAGNOSIS — M65341 Trigger finger, right ring finger: Secondary | ICD-10-CM | POA: Diagnosis not present

## 2019-05-14 DIAGNOSIS — S322XXA Fracture of coccyx, initial encounter for closed fracture: Secondary | ICD-10-CM | POA: Diagnosis not present

## 2019-05-15 DIAGNOSIS — J301 Allergic rhinitis due to pollen: Secondary | ICD-10-CM | POA: Diagnosis not present

## 2019-05-15 DIAGNOSIS — J3081 Allergic rhinitis due to animal (cat) (dog) hair and dander: Secondary | ICD-10-CM | POA: Diagnosis not present

## 2019-05-15 DIAGNOSIS — J3089 Other allergic rhinitis: Secondary | ICD-10-CM | POA: Diagnosis not present

## 2019-05-19 ENCOUNTER — Other Ambulatory Visit: Payer: Federal, State, Local not specified - PPO

## 2019-05-21 DIAGNOSIS — Z9889 Other specified postprocedural states: Secondary | ICD-10-CM | POA: Diagnosis not present

## 2019-05-23 DIAGNOSIS — M65332 Trigger finger, left middle finger: Secondary | ICD-10-CM | POA: Diagnosis not present

## 2019-05-23 DIAGNOSIS — M25642 Stiffness of left hand, not elsewhere classified: Secondary | ICD-10-CM | POA: Diagnosis not present

## 2019-06-05 DIAGNOSIS — H40021 Open angle with borderline findings, high risk, right eye: Secondary | ICD-10-CM | POA: Diagnosis not present

## 2019-06-05 DIAGNOSIS — H40012 Open angle with borderline findings, low risk, left eye: Secondary | ICD-10-CM | POA: Diagnosis not present

## 2019-06-10 DIAGNOSIS — E039 Hypothyroidism, unspecified: Secondary | ICD-10-CM | POA: Diagnosis not present

## 2019-06-10 DIAGNOSIS — E113292 Type 2 diabetes mellitus with mild nonproliferative diabetic retinopathy without macular edema, left eye: Secondary | ICD-10-CM | POA: Diagnosis not present

## 2019-06-10 DIAGNOSIS — E782 Mixed hyperlipidemia: Secondary | ICD-10-CM | POA: Diagnosis not present

## 2019-06-12 DIAGNOSIS — J3089 Other allergic rhinitis: Secondary | ICD-10-CM | POA: Diagnosis not present

## 2019-06-12 DIAGNOSIS — J301 Allergic rhinitis due to pollen: Secondary | ICD-10-CM | POA: Diagnosis not present

## 2019-06-12 DIAGNOSIS — J3081 Allergic rhinitis due to animal (cat) (dog) hair and dander: Secondary | ICD-10-CM | POA: Diagnosis not present

## 2019-06-26 DIAGNOSIS — S52611A Displaced fracture of right ulna styloid process, initial encounter for closed fracture: Secondary | ICD-10-CM | POA: Diagnosis not present

## 2019-06-26 DIAGNOSIS — S52501A Unspecified fracture of the lower end of right radius, initial encounter for closed fracture: Secondary | ICD-10-CM | POA: Diagnosis not present

## 2019-06-26 DIAGNOSIS — W0110XA Fall on same level from slipping, tripping and stumbling with subsequent striking against unspecified object, initial encounter: Secondary | ICD-10-CM | POA: Diagnosis not present

## 2019-06-26 DIAGNOSIS — Z79899 Other long term (current) drug therapy: Secondary | ICD-10-CM | POA: Diagnosis not present

## 2019-06-26 DIAGNOSIS — M25531 Pain in right wrist: Secondary | ICD-10-CM | POA: Diagnosis not present

## 2019-06-26 DIAGNOSIS — Z7984 Long term (current) use of oral hypoglycemic drugs: Secondary | ICD-10-CM | POA: Diagnosis not present

## 2019-06-26 DIAGNOSIS — S52614A Nondisplaced fracture of right ulna styloid process, initial encounter for closed fracture: Secondary | ICD-10-CM | POA: Diagnosis not present

## 2019-06-26 DIAGNOSIS — S52591A Other fractures of lower end of right radius, initial encounter for closed fracture: Secondary | ICD-10-CM | POA: Diagnosis not present

## 2019-06-26 DIAGNOSIS — S5291XA Unspecified fracture of right forearm, initial encounter for closed fracture: Secondary | ICD-10-CM | POA: Diagnosis not present

## 2019-06-26 DIAGNOSIS — E119 Type 2 diabetes mellitus without complications: Secondary | ICD-10-CM | POA: Diagnosis not present

## 2019-06-26 DIAGNOSIS — S52201A Unspecified fracture of shaft of right ulna, initial encounter for closed fracture: Secondary | ICD-10-CM | POA: Diagnosis not present

## 2019-06-26 DIAGNOSIS — Z7982 Long term (current) use of aspirin: Secondary | ICD-10-CM | POA: Diagnosis not present

## 2019-06-26 DIAGNOSIS — E079 Disorder of thyroid, unspecified: Secondary | ICD-10-CM | POA: Diagnosis not present

## 2019-06-26 DIAGNOSIS — Z885 Allergy status to narcotic agent status: Secondary | ICD-10-CM | POA: Diagnosis not present

## 2019-06-30 DIAGNOSIS — S52531A Colles' fracture of right radius, initial encounter for closed fracture: Secondary | ICD-10-CM | POA: Diagnosis not present

## 2019-07-08 DIAGNOSIS — M25531 Pain in right wrist: Secondary | ICD-10-CM | POA: Diagnosis not present

## 2019-07-10 DIAGNOSIS — J3081 Allergic rhinitis due to animal (cat) (dog) hair and dander: Secondary | ICD-10-CM | POA: Diagnosis not present

## 2019-07-10 DIAGNOSIS — J301 Allergic rhinitis due to pollen: Secondary | ICD-10-CM | POA: Diagnosis not present

## 2019-07-10 DIAGNOSIS — J3089 Other allergic rhinitis: Secondary | ICD-10-CM | POA: Diagnosis not present

## 2019-07-21 DIAGNOSIS — Z9889 Other specified postprocedural states: Secondary | ICD-10-CM | POA: Diagnosis not present

## 2019-07-21 DIAGNOSIS — S52531A Colles' fracture of right radius, initial encounter for closed fracture: Secondary | ICD-10-CM | POA: Diagnosis not present

## 2019-07-23 DIAGNOSIS — Z9889 Other specified postprocedural states: Secondary | ICD-10-CM | POA: Diagnosis not present

## 2019-07-23 DIAGNOSIS — S52531A Colles' fracture of right radius, initial encounter for closed fracture: Secondary | ICD-10-CM | POA: Diagnosis not present

## 2019-07-25 DIAGNOSIS — J301 Allergic rhinitis due to pollen: Secondary | ICD-10-CM | POA: Diagnosis not present

## 2019-07-25 DIAGNOSIS — J3081 Allergic rhinitis due to animal (cat) (dog) hair and dander: Secondary | ICD-10-CM | POA: Diagnosis not present

## 2019-07-25 DIAGNOSIS — J3089 Other allergic rhinitis: Secondary | ICD-10-CM | POA: Diagnosis not present

## 2019-08-07 DIAGNOSIS — J3089 Other allergic rhinitis: Secondary | ICD-10-CM | POA: Diagnosis not present

## 2019-08-07 DIAGNOSIS — J3081 Allergic rhinitis due to animal (cat) (dog) hair and dander: Secondary | ICD-10-CM | POA: Diagnosis not present

## 2019-08-07 DIAGNOSIS — J301 Allergic rhinitis due to pollen: Secondary | ICD-10-CM | POA: Diagnosis not present

## 2019-08-11 DIAGNOSIS — S52531A Colles' fracture of right radius, initial encounter for closed fracture: Secondary | ICD-10-CM | POA: Diagnosis not present

## 2019-08-14 DIAGNOSIS — M25631 Stiffness of right wrist, not elsewhere classified: Secondary | ICD-10-CM | POA: Diagnosis not present

## 2019-08-14 DIAGNOSIS — S52531D Colles' fracture of right radius, subsequent encounter for closed fracture with routine healing: Secondary | ICD-10-CM | POA: Diagnosis not present

## 2019-08-19 DIAGNOSIS — M25631 Stiffness of right wrist, not elsewhere classified: Secondary | ICD-10-CM | POA: Diagnosis not present

## 2019-08-19 DIAGNOSIS — S52531D Colles' fracture of right radius, subsequent encounter for closed fracture with routine healing: Secondary | ICD-10-CM | POA: Diagnosis not present

## 2019-08-20 DIAGNOSIS — S52531D Colles' fracture of right radius, subsequent encounter for closed fracture with routine healing: Secondary | ICD-10-CM | POA: Diagnosis not present

## 2019-08-20 DIAGNOSIS — M25631 Stiffness of right wrist, not elsewhere classified: Secondary | ICD-10-CM | POA: Diagnosis not present

## 2019-08-25 DIAGNOSIS — S52531D Colles' fracture of right radius, subsequent encounter for closed fracture with routine healing: Secondary | ICD-10-CM | POA: Diagnosis not present

## 2019-08-25 DIAGNOSIS — M25631 Stiffness of right wrist, not elsewhere classified: Secondary | ICD-10-CM | POA: Diagnosis not present

## 2019-08-27 DIAGNOSIS — S52531D Colles' fracture of right radius, subsequent encounter for closed fracture with routine healing: Secondary | ICD-10-CM | POA: Diagnosis not present

## 2019-08-27 DIAGNOSIS — M25631 Stiffness of right wrist, not elsewhere classified: Secondary | ICD-10-CM | POA: Diagnosis not present

## 2019-09-01 DIAGNOSIS — S52531D Colles' fracture of right radius, subsequent encounter for closed fracture with routine healing: Secondary | ICD-10-CM | POA: Diagnosis not present

## 2019-09-01 DIAGNOSIS — M25631 Stiffness of right wrist, not elsewhere classified: Secondary | ICD-10-CM | POA: Diagnosis not present

## 2019-09-03 DIAGNOSIS — S52531D Colles' fracture of right radius, subsequent encounter for closed fracture with routine healing: Secondary | ICD-10-CM | POA: Diagnosis not present

## 2019-09-03 DIAGNOSIS — M25631 Stiffness of right wrist, not elsewhere classified: Secondary | ICD-10-CM | POA: Diagnosis not present

## 2019-09-04 DIAGNOSIS — J3081 Allergic rhinitis due to animal (cat) (dog) hair and dander: Secondary | ICD-10-CM | POA: Diagnosis not present

## 2019-09-04 DIAGNOSIS — J301 Allergic rhinitis due to pollen: Secondary | ICD-10-CM | POA: Diagnosis not present

## 2019-09-04 DIAGNOSIS — J3089 Other allergic rhinitis: Secondary | ICD-10-CM | POA: Diagnosis not present

## 2019-09-09 DIAGNOSIS — Z9889 Other specified postprocedural states: Secondary | ICD-10-CM | POA: Diagnosis not present

## 2019-10-02 DIAGNOSIS — J3089 Other allergic rhinitis: Secondary | ICD-10-CM | POA: Diagnosis not present

## 2019-10-02 DIAGNOSIS — J301 Allergic rhinitis due to pollen: Secondary | ICD-10-CM | POA: Diagnosis not present

## 2019-10-02 DIAGNOSIS — J3081 Allergic rhinitis due to animal (cat) (dog) hair and dander: Secondary | ICD-10-CM | POA: Diagnosis not present

## 2019-10-07 DIAGNOSIS — M25531 Pain in right wrist: Secondary | ICD-10-CM | POA: Diagnosis not present

## 2019-10-14 DIAGNOSIS — E782 Mixed hyperlipidemia: Secondary | ICD-10-CM | POA: Diagnosis not present

## 2019-10-14 DIAGNOSIS — E113292 Type 2 diabetes mellitus with mild nonproliferative diabetic retinopathy without macular edema, left eye: Secondary | ICD-10-CM | POA: Diagnosis not present

## 2019-10-14 DIAGNOSIS — E039 Hypothyroidism, unspecified: Secondary | ICD-10-CM | POA: Diagnosis not present

## 2019-10-30 DIAGNOSIS — J3089 Other allergic rhinitis: Secondary | ICD-10-CM | POA: Diagnosis not present

## 2019-10-30 DIAGNOSIS — J301 Allergic rhinitis due to pollen: Secondary | ICD-10-CM | POA: Diagnosis not present

## 2019-10-30 DIAGNOSIS — J3081 Allergic rhinitis due to animal (cat) (dog) hair and dander: Secondary | ICD-10-CM | POA: Diagnosis not present

## 2019-11-27 DIAGNOSIS — J301 Allergic rhinitis due to pollen: Secondary | ICD-10-CM | POA: Diagnosis not present

## 2019-11-27 DIAGNOSIS — J3081 Allergic rhinitis due to animal (cat) (dog) hair and dander: Secondary | ICD-10-CM | POA: Diagnosis not present

## 2019-11-27 DIAGNOSIS — J3089 Other allergic rhinitis: Secondary | ICD-10-CM | POA: Diagnosis not present

## 2019-12-29 DIAGNOSIS — M79672 Pain in left foot: Secondary | ICD-10-CM | POA: Diagnosis not present

## 2019-12-29 DIAGNOSIS — M1712 Unilateral primary osteoarthritis, left knee: Secondary | ICD-10-CM | POA: Diagnosis not present

## 2019-12-29 DIAGNOSIS — M1711 Unilateral primary osteoarthritis, right knee: Secondary | ICD-10-CM | POA: Diagnosis not present

## 2020-01-01 DIAGNOSIS — J3081 Allergic rhinitis due to animal (cat) (dog) hair and dander: Secondary | ICD-10-CM | POA: Diagnosis not present

## 2020-01-01 DIAGNOSIS — J301 Allergic rhinitis due to pollen: Secondary | ICD-10-CM | POA: Diagnosis not present

## 2020-01-01 DIAGNOSIS — J3089 Other allergic rhinitis: Secondary | ICD-10-CM | POA: Diagnosis not present

## 2020-01-15 DIAGNOSIS — J3089 Other allergic rhinitis: Secondary | ICD-10-CM | POA: Diagnosis not present

## 2020-01-15 DIAGNOSIS — J301 Allergic rhinitis due to pollen: Secondary | ICD-10-CM | POA: Diagnosis not present

## 2020-01-15 DIAGNOSIS — J3081 Allergic rhinitis due to animal (cat) (dog) hair and dander: Secondary | ICD-10-CM | POA: Diagnosis not present

## 2020-02-02 DIAGNOSIS — J3089 Other allergic rhinitis: Secondary | ICD-10-CM | POA: Diagnosis not present

## 2020-02-02 DIAGNOSIS — J3081 Allergic rhinitis due to animal (cat) (dog) hair and dander: Secondary | ICD-10-CM | POA: Diagnosis not present

## 2020-02-02 DIAGNOSIS — J301 Allergic rhinitis due to pollen: Secondary | ICD-10-CM | POA: Diagnosis not present

## 2020-02-03 DIAGNOSIS — G5762 Lesion of plantar nerve, left lower limb: Secondary | ICD-10-CM | POA: Diagnosis not present

## 2020-02-16 DIAGNOSIS — J3089 Other allergic rhinitis: Secondary | ICD-10-CM | POA: Diagnosis not present

## 2020-02-16 DIAGNOSIS — J301 Allergic rhinitis due to pollen: Secondary | ICD-10-CM | POA: Diagnosis not present

## 2020-02-16 DIAGNOSIS — J3081 Allergic rhinitis due to animal (cat) (dog) hair and dander: Secondary | ICD-10-CM | POA: Diagnosis not present

## 2020-02-24 DIAGNOSIS — M545 Low back pain: Secondary | ICD-10-CM | POA: Diagnosis not present

## 2020-03-01 DIAGNOSIS — J301 Allergic rhinitis due to pollen: Secondary | ICD-10-CM | POA: Diagnosis not present

## 2020-03-01 DIAGNOSIS — J3089 Other allergic rhinitis: Secondary | ICD-10-CM | POA: Diagnosis not present

## 2020-03-01 DIAGNOSIS — J3081 Allergic rhinitis due to animal (cat) (dog) hair and dander: Secondary | ICD-10-CM | POA: Diagnosis not present

## 2020-03-02 DIAGNOSIS — J3089 Other allergic rhinitis: Secondary | ICD-10-CM | POA: Diagnosis not present

## 2020-03-02 DIAGNOSIS — L508 Other urticaria: Secondary | ICD-10-CM | POA: Diagnosis not present

## 2020-03-02 DIAGNOSIS — J3081 Allergic rhinitis due to animal (cat) (dog) hair and dander: Secondary | ICD-10-CM | POA: Diagnosis not present

## 2020-03-02 DIAGNOSIS — J301 Allergic rhinitis due to pollen: Secondary | ICD-10-CM | POA: Diagnosis not present

## 2020-03-16 DIAGNOSIS — J3081 Allergic rhinitis due to animal (cat) (dog) hair and dander: Secondary | ICD-10-CM | POA: Diagnosis not present

## 2020-03-16 DIAGNOSIS — J301 Allergic rhinitis due to pollen: Secondary | ICD-10-CM | POA: Diagnosis not present

## 2020-03-16 DIAGNOSIS — J3089 Other allergic rhinitis: Secondary | ICD-10-CM | POA: Diagnosis not present

## 2020-03-29 DIAGNOSIS — M545 Low back pain: Secondary | ICD-10-CM | POA: Diagnosis not present

## 2020-03-30 DIAGNOSIS — J301 Allergic rhinitis due to pollen: Secondary | ICD-10-CM | POA: Diagnosis not present

## 2020-03-30 DIAGNOSIS — J3081 Allergic rhinitis due to animal (cat) (dog) hair and dander: Secondary | ICD-10-CM | POA: Diagnosis not present

## 2020-03-30 DIAGNOSIS — J3089 Other allergic rhinitis: Secondary | ICD-10-CM | POA: Diagnosis not present

## 2020-04-13 DIAGNOSIS — J3081 Allergic rhinitis due to animal (cat) (dog) hair and dander: Secondary | ICD-10-CM | POA: Diagnosis not present

## 2020-04-13 DIAGNOSIS — J301 Allergic rhinitis due to pollen: Secondary | ICD-10-CM | POA: Diagnosis not present

## 2020-04-13 DIAGNOSIS — J3089 Other allergic rhinitis: Secondary | ICD-10-CM | POA: Diagnosis not present

## 2020-04-20 DIAGNOSIS — E7800 Pure hypercholesterolemia, unspecified: Secondary | ICD-10-CM | POA: Diagnosis not present

## 2020-04-20 DIAGNOSIS — Z23 Encounter for immunization: Secondary | ICD-10-CM | POA: Diagnosis not present

## 2020-04-20 DIAGNOSIS — Z7189 Other specified counseling: Secondary | ICD-10-CM | POA: Diagnosis not present

## 2020-04-20 DIAGNOSIS — D509 Iron deficiency anemia, unspecified: Secondary | ICD-10-CM | POA: Diagnosis not present

## 2020-04-27 DIAGNOSIS — J3089 Other allergic rhinitis: Secondary | ICD-10-CM | POA: Diagnosis not present

## 2020-04-27 DIAGNOSIS — J301 Allergic rhinitis due to pollen: Secondary | ICD-10-CM | POA: Diagnosis not present

## 2020-04-27 DIAGNOSIS — J3081 Allergic rhinitis due to animal (cat) (dog) hair and dander: Secondary | ICD-10-CM | POA: Diagnosis not present

## 2020-04-28 DIAGNOSIS — H04129 Dry eye syndrome of unspecified lacrimal gland: Secondary | ICD-10-CM | POA: Diagnosis not present

## 2020-04-28 DIAGNOSIS — J301 Allergic rhinitis due to pollen: Secondary | ICD-10-CM | POA: Diagnosis not present

## 2020-04-28 DIAGNOSIS — J3089 Other allergic rhinitis: Secondary | ICD-10-CM | POA: Diagnosis not present

## 2020-04-28 DIAGNOSIS — J3081 Allergic rhinitis due to animal (cat) (dog) hair and dander: Secondary | ICD-10-CM | POA: Diagnosis not present

## 2020-05-24 ENCOUNTER — Other Ambulatory Visit (HOSPITAL_COMMUNITY)
Admission: RE | Admit: 2020-05-24 | Discharge: 2020-05-24 | Disposition: A | Discharge status: Home / Self Care | Payer: Federal, State, Local not specified - PPO | Source: Ambulatory Visit | Attending: Family Medicine | Admitting: Family Medicine

## 2020-05-24 ENCOUNTER — Other Ambulatory Visit: Payer: Self-pay | Admitting: Family Medicine

## 2020-05-24 DIAGNOSIS — Z13220 Encounter for screening for lipoid disorders: Secondary | ICD-10-CM | POA: Diagnosis not present

## 2020-05-24 DIAGNOSIS — Z124 Encounter for screening for malignant neoplasm of cervix: Secondary | ICD-10-CM | POA: Insufficient documentation

## 2020-05-24 DIAGNOSIS — E119 Type 2 diabetes mellitus without complications: Secondary | ICD-10-CM | POA: Diagnosis not present

## 2020-05-24 DIAGNOSIS — Z0001 Encounter for general adult medical examination with abnormal findings: Secondary | ICD-10-CM | POA: Diagnosis not present

## 2020-05-26 ENCOUNTER — Other Ambulatory Visit: Payer: Self-pay | Admitting: Family Medicine

## 2020-05-26 DIAGNOSIS — N63 Unspecified lump in unspecified breast: Secondary | ICD-10-CM

## 2020-05-26 LAB — CYTOLOGY - PAP
Comment: NEGATIVE
Diagnosis: NEGATIVE
High risk HPV: NEGATIVE

## 2020-05-27 ENCOUNTER — Other Ambulatory Visit: Payer: Self-pay | Admitting: Family Medicine

## 2020-05-27 DIAGNOSIS — E2839 Other primary ovarian failure: Secondary | ICD-10-CM

## 2020-08-25 ENCOUNTER — Other Ambulatory Visit: Payer: Federal, State, Local not specified - PPO

## 2020-08-27 ENCOUNTER — Ambulatory Visit
Admission: RE | Admit: 2020-08-27 | Discharge: 2020-08-27 | Disposition: A | Discharge status: Home / Self Care | Payer: Federal, State, Local not specified - PPO | Source: Ambulatory Visit | Attending: Family Medicine | Admitting: Family Medicine

## 2020-08-27 ENCOUNTER — Other Ambulatory Visit: Payer: Self-pay

## 2020-08-27 DIAGNOSIS — E2839 Other primary ovarian failure: Secondary | ICD-10-CM

## 2020-08-27 DIAGNOSIS — Z780 Asymptomatic menopausal state: Secondary | ICD-10-CM | POA: Diagnosis not present

## 2020-08-27 DIAGNOSIS — M810 Age-related osteoporosis without current pathological fracture: Secondary | ICD-10-CM | POA: Diagnosis not present

## 2020-09-24 DIAGNOSIS — L853 Xerosis cutis: Secondary | ICD-10-CM | POA: Diagnosis not present

## 2020-09-24 DIAGNOSIS — R21 Rash and other nonspecific skin eruption: Secondary | ICD-10-CM | POA: Diagnosis not present

## 2020-09-30 DIAGNOSIS — M810 Age-related osteoporosis without current pathological fracture: Secondary | ICD-10-CM | POA: Diagnosis not present

## 2020-10-06 DIAGNOSIS — H2513 Age-related nuclear cataract, bilateral: Secondary | ICD-10-CM | POA: Diagnosis not present

## 2020-10-06 DIAGNOSIS — E119 Type 2 diabetes mellitus without complications: Secondary | ICD-10-CM | POA: Diagnosis not present

## 2020-10-06 DIAGNOSIS — H40021 Open angle with borderline findings, high risk, right eye: Secondary | ICD-10-CM | POA: Diagnosis not present

## 2020-10-06 DIAGNOSIS — H40012 Open angle with borderline findings, low risk, left eye: Secondary | ICD-10-CM | POA: Diagnosis not present

## 2020-12-07 DIAGNOSIS — E039 Hypothyroidism, unspecified: Secondary | ICD-10-CM | POA: Diagnosis not present

## 2020-12-07 DIAGNOSIS — S61215A Laceration without foreign body of left ring finger without damage to nail, initial encounter: Secondary | ICD-10-CM | POA: Diagnosis not present

## 2020-12-07 DIAGNOSIS — E782 Mixed hyperlipidemia: Secondary | ICD-10-CM | POA: Diagnosis not present

## 2020-12-07 DIAGNOSIS — E113292 Type 2 diabetes mellitus with mild nonproliferative diabetic retinopathy without macular edema, left eye: Secondary | ICD-10-CM | POA: Diagnosis not present

## 2020-12-07 DIAGNOSIS — R202 Paresthesia of skin: Secondary | ICD-10-CM | POA: Diagnosis not present

## 2020-12-17 DIAGNOSIS — Z4802 Encounter for removal of sutures: Secondary | ICD-10-CM | POA: Diagnosis not present

## 2020-12-25 DIAGNOSIS — L03012 Cellulitis of left finger: Secondary | ICD-10-CM | POA: Diagnosis not present

## 2021-01-01 DIAGNOSIS — S61214S Laceration without foreign body of right ring finger without damage to nail, sequela: Secondary | ICD-10-CM | POA: Diagnosis not present

## 2021-02-13 DIAGNOSIS — M25562 Pain in left knee: Secondary | ICD-10-CM | POA: Diagnosis not present

## 2021-02-13 DIAGNOSIS — S8392XA Sprain of unspecified site of left knee, initial encounter: Secondary | ICD-10-CM | POA: Diagnosis not present

## 2021-02-13 DIAGNOSIS — E079 Disorder of thyroid, unspecified: Secondary | ICD-10-CM | POA: Diagnosis not present

## 2021-02-13 DIAGNOSIS — Z7982 Long term (current) use of aspirin: Secondary | ICD-10-CM | POA: Diagnosis not present

## 2021-02-13 DIAGNOSIS — E119 Type 2 diabetes mellitus without complications: Secondary | ICD-10-CM | POA: Diagnosis not present

## 2021-02-13 DIAGNOSIS — Z885 Allergy status to narcotic agent status: Secondary | ICD-10-CM | POA: Diagnosis not present

## 2021-02-13 DIAGNOSIS — G8911 Acute pain due to trauma: Secondary | ICD-10-CM | POA: Diagnosis not present

## 2021-02-13 DIAGNOSIS — M25762 Osteophyte, left knee: Secondary | ICD-10-CM | POA: Diagnosis not present

## 2021-02-13 DIAGNOSIS — Z7984 Long term (current) use of oral hypoglycemic drugs: Secondary | ICD-10-CM | POA: Diagnosis not present

## 2021-02-13 DIAGNOSIS — Y9222 Religious institution as the place of occurrence of the external cause: Secondary | ICD-10-CM | POA: Diagnosis not present

## 2021-02-13 DIAGNOSIS — M25462 Effusion, left knee: Secondary | ICD-10-CM | POA: Diagnosis not present

## 2021-02-13 DIAGNOSIS — X500XXA Overexertion from strenuous movement or load, initial encounter: Secondary | ICD-10-CM | POA: Diagnosis not present

## 2021-02-13 DIAGNOSIS — M1712 Unilateral primary osteoarthritis, left knee: Secondary | ICD-10-CM | POA: Diagnosis not present

## 2021-02-14 DIAGNOSIS — M25762 Osteophyte, left knee: Secondary | ICD-10-CM | POA: Diagnosis not present

## 2021-02-14 DIAGNOSIS — M1712 Unilateral primary osteoarthritis, left knee: Secondary | ICD-10-CM | POA: Diagnosis not present

## 2021-02-17 DIAGNOSIS — M25562 Pain in left knee: Secondary | ICD-10-CM | POA: Diagnosis not present

## 2021-02-24 DIAGNOSIS — M25562 Pain in left knee: Secondary | ICD-10-CM | POA: Diagnosis not present

## 2021-02-28 DIAGNOSIS — E119 Type 2 diabetes mellitus without complications: Secondary | ICD-10-CM | POA: Diagnosis not present

## 2021-02-28 DIAGNOSIS — Z7984 Long term (current) use of oral hypoglycemic drugs: Secondary | ICD-10-CM | POA: Diagnosis not present

## 2021-02-28 DIAGNOSIS — H04123 Dry eye syndrome of bilateral lacrimal glands: Secondary | ICD-10-CM | POA: Diagnosis not present

## 2021-03-04 DIAGNOSIS — E039 Hypothyroidism, unspecified: Secondary | ICD-10-CM | POA: Diagnosis not present

## 2021-03-04 DIAGNOSIS — E113292 Type 2 diabetes mellitus with mild nonproliferative diabetic retinopathy without macular edema, left eye: Secondary | ICD-10-CM | POA: Diagnosis not present

## 2021-03-04 DIAGNOSIS — E782 Mixed hyperlipidemia: Secondary | ICD-10-CM | POA: Diagnosis not present

## 2021-03-04 DIAGNOSIS — M810 Age-related osteoporosis without current pathological fracture: Secondary | ICD-10-CM | POA: Diagnosis not present

## 2021-03-07 ENCOUNTER — Other Ambulatory Visit: Payer: Self-pay

## 2021-03-07 DIAGNOSIS — M25562 Pain in left knee: Secondary | ICD-10-CM

## 2021-03-20 ENCOUNTER — Other Ambulatory Visit: Payer: Self-pay

## 2021-03-20 ENCOUNTER — Ambulatory Visit
Admission: RE | Admit: 2021-03-20 | Discharge: 2021-03-20 | Disposition: A | Discharge status: Home / Self Care | Payer: Federal, State, Local not specified - PPO | Source: Ambulatory Visit | Attending: Orthopedic Surgery | Admitting: Orthopedic Surgery

## 2021-03-20 DIAGNOSIS — M25562 Pain in left knee: Secondary | ICD-10-CM

## 2021-03-24 DIAGNOSIS — M1712 Unilateral primary osteoarthritis, left knee: Secondary | ICD-10-CM | POA: Diagnosis not present

## 2021-03-24 DIAGNOSIS — M25562 Pain in left knee: Secondary | ICD-10-CM | POA: Diagnosis not present

## 2021-04-01 DIAGNOSIS — M810 Age-related osteoporosis without current pathological fracture: Secondary | ICD-10-CM | POA: Diagnosis not present

## 2022-01-30 DIAGNOSIS — E039 Hypothyroidism, unspecified: Secondary | ICD-10-CM | POA: Diagnosis not present

## 2022-01-30 DIAGNOSIS — E782 Mixed hyperlipidemia: Secondary | ICD-10-CM | POA: Diagnosis not present

## 2022-01-30 DIAGNOSIS — Z7984 Long term (current) use of oral hypoglycemic drugs: Secondary | ICD-10-CM | POA: Diagnosis not present

## 2022-01-30 DIAGNOSIS — E113292 Type 2 diabetes mellitus with mild nonproliferative diabetic retinopathy without macular edema, left eye: Secondary | ICD-10-CM | POA: Diagnosis not present

## 2022-02-01 DIAGNOSIS — M810 Age-related osteoporosis without current pathological fracture: Secondary | ICD-10-CM | POA: Diagnosis not present

## 2022-02-01 DIAGNOSIS — E113292 Type 2 diabetes mellitus with mild nonproliferative diabetic retinopathy without macular edema, left eye: Secondary | ICD-10-CM | POA: Diagnosis not present

## 2022-02-01 DIAGNOSIS — E039 Hypothyroidism, unspecified: Secondary | ICD-10-CM | POA: Diagnosis not present

## 2022-02-01 DIAGNOSIS — E538 Deficiency of other specified B group vitamins: Secondary | ICD-10-CM | POA: Diagnosis not present

## 2022-02-01 DIAGNOSIS — E782 Mixed hyperlipidemia: Secondary | ICD-10-CM | POA: Diagnosis not present

## 2022-02-27 DIAGNOSIS — M810 Age-related osteoporosis without current pathological fracture: Secondary | ICD-10-CM | POA: Diagnosis not present

## 2022-03-01 ENCOUNTER — Emergency Department
Admission: EM | Admit: 2022-03-01 | Discharge: 2022-03-01 | Disposition: A | Discharge status: Home / Self Care | Payer: Federal, State, Local not specified - PPO | Source: Home / Self Care | Attending: Family Medicine | Admitting: Family Medicine

## 2022-03-01 ENCOUNTER — Encounter: Payer: Self-pay | Admitting: Emergency Medicine

## 2022-03-01 DIAGNOSIS — E039 Hypothyroidism, unspecified: Secondary | ICD-10-CM | POA: Insufficient documentation

## 2022-03-01 DIAGNOSIS — R6883 Chills (without fever): Secondary | ICD-10-CM

## 2022-03-01 DIAGNOSIS — E782 Mixed hyperlipidemia: Secondary | ICD-10-CM | POA: Insufficient documentation

## 2022-03-01 DIAGNOSIS — M810 Age-related osteoporosis without current pathological fracture: Secondary | ICD-10-CM | POA: Insufficient documentation

## 2022-03-01 DIAGNOSIS — E119 Type 2 diabetes mellitus without complications: Secondary | ICD-10-CM | POA: Insufficient documentation

## 2022-03-01 DIAGNOSIS — E538 Deficiency of other specified B group vitamins: Secondary | ICD-10-CM | POA: Insufficient documentation

## 2022-03-01 LAB — POC SARS CORONAVIRUS 2 AG -  ED: SARS Coronavirus 2 Ag: NEGATIVE

## 2022-03-01 LAB — POCT INFLUENZA A/B
Influenza A, POC: NEGATIVE
Influenza B, POC: NEGATIVE

## 2022-03-01 NOTE — ED Provider Notes (Signed)
?[facility] ? ? ? ?CSN: [id number] ?Arrival date & time: 03/01/22  9622 ? ? ?  ? ?History   ?Chief Complaint ?Chief Complaint  ?Patient presents with  ? Chills  ? ? ?HPI ?Allison [name] is a 65 y.o. female.  ? ?HPI ? ?Pleasant 66 year old woman.  History of osteoporosis.  Had her first infusion of Reclast on 02/27/2022.  She states she felt fine immediately after the infusion but later in the evening developed sweats and chills, body aches, had to go to bed and was covered with quotes.  She did not take her temperature.  She called her endocrinologist.  She was advised to go to an urgent care center to be checked for infection.  She was told that she might have some chills and aches after the infusion ? ?Past Medical History:  ?Diagnosis Date  ? Diabetes mellitus without complication (HCC)   ? Thyroid disease   ? ? ?Patient Active Problem List  ? Diagnosis Date Noted  ? Osteoporosis, senile 03/01/2022  ? Type 2 diabetes mellitus (HCC) 03/01/2022  ? Hypothyroidism 03/01/2022  ? B12 deficiency 03/01/2022  ? Mixed hyperlipidemia 03/01/2022  ? ? ?History reviewed. No pertinent surgical history. ? ?OB History   ?No obstetric history on file. ?  ? ? ? ?Home Medications   ? ?Prior to Admission medications   ?Medication Sig Start Date End Date Taking? Authorizing Provider  ?Cholecalciferol 25 MCG (1000 UT) tablet Take by mouth. 09/10/20  Yes [provider]  ?pioglitazone (ACTOS) 45 MG tablet 1 tablet 08/31/14  Yes [provider]  ?acetaminophen (TYLENOL) 500 MG tablet Take 1 tablet (500 mg total) by mouth every 6 (six) hours as needed. ?Patient not taking: Reported on 03/01/2022 09/21/14   Joycie Peek, PA-C  ?glimepiride (AMARYL) 2 MG tablet Take 2 mg by mouth every morning. 02/14/22   [provider]  ?levothyroxine (SYNTHROID) 100 MCG tablet Take by mouth. 02/14/22   [provider]  ?metFORMIN (GLUCOPHAGE) 1000 MG tablet Take 1,000 mg by mouth 2 (two) times daily. 08/29/14    [provider]  ?vitamin B-12 (CYANOCOBALAMIN) 100 MCG tablet See admin instructions.    [provider]  ? ? ?Family History ?Family History  ?Problem Relation Age of Onset  ? Diabetes Father   ? ? ?Social History ?Social History  ? ?Tobacco Use  ? Smoking status: Never  ?Vaping Use  ? Vaping Use: Never used  ?Substance Use Topics  ? Alcohol use: Not Currently  ? Drug use: Never  ? ? ? ?Allergies   ?Hydrocodone ? ? ?Review of Systems ?Review of Systems ?See HPI ? ?Physical Exam ?Triage Vital Signs ?ED Triage Vitals  ?Enc Vitals Group  ?   BP 03/01/22 0918 109/73  ?   Pulse Rate 03/01/22 0918 79  ?   Resp 03/01/22 0918 15  ?   Temp 03/01/22 0918 98.8 ?F (37.1 ?C)  ?   Temp Source 03/01/22 0918 Oral  ?   SpO2 03/01/22 0918 97 %  ?   Weight 03/01/22 0920 155 lb (70.3 kg)  ?   Height 03/01/22 0920 5\' 3"  (1.6 m)  ?   Head Circumference --   ?   Peak Flow --   ?   Pain Score 03/01/22 0920 2  ?   Pain Loc --   ?   Pain Edu? --   ?   Excl. in GC? --   ? ?No data found. ? ?Updated Vital Signs ?  BP 109/73 (BP Location: Left Arm)   Pulse 79   Temp 98.8 ?F (37.1 ?C) (Oral)   Resp 15   Ht 5\' 3"  (1.6 m)   Wt 70.3 kg   SpO2 97%   BMI 27.46 kg/m?  ?   ? ?Physical Exam ?Constitutional:   ?   General: She is not in acute distress. ?   Appearance: She is well-developed. She is not ill-appearing.  ?HENT:  ?   Head: Normocephalic and atraumatic.  ?Eyes:  ?   Conjunctiva/sclera: Conjunctivae normal.  ?   Pupils: Pupils are equal, round, and reactive to light.  ?Cardiovascular:  ?   Rate and Rhythm: Normal rate.  ?Pulmonary:  ?   Effort: Pulmonary effort is normal. No respiratory distress.  ?Abdominal:  ?   General: There is no distension.  ?   Palpations: Abdomen is soft.  ?Musculoskeletal:     ?   General: Normal range of motion.  ?   Cervical back: Normal range of motion.  ?Skin: ?   General: Skin is warm and dry.  ?Neurological:  ?   Mental Status: She is alert.  ? ? ? ?UC Treatments / Results  ?Labs ?(all  labs ordered are listed, but only abnormal results are displayed) ?Labs Reviewed  ?POCT INFLUENZA A/B  ?POC SARS CORONAVIRUS 2 AG -  ED  ? ? ?EKG ? ? ?Radiology ?No results found. ? ?Procedures ?Procedures (including critical care time) ? ?Medications Ordered in UC ?Medications - No data to display ? ?Initial Impression / Assessment and Plan / UC Course  ?I have reviewed the triage vital signs and the nursing notes. ? ?Pertinent labs & imaging results that were available during my care of the patient were reviewed by me and considered in my medical decision making (see chart for details). ? ?  ? ?Flu and COVID tests are negative.  I informed the patient I believe she is having a reaction to her infusion.  This will pass with time.  Follow-up as needed ?Final Clinical Impressions(s) / UC Diagnoses  ? ?Final diagnoses:  ?Chills  ? ? ? ?Discharge Instructions   ? ?  ?Your chills, sweats, and body aches are likely a reaction to the medication.  They will resolve over time ?May take Tylenol for pain ?Drink lots of water ?See your doctor if symptoms persist ? ? ? ? ?ED Prescriptions   ?None ?  ? ?PDMP not reviewed this encounter. ?  ? , MD ?03/01/22 1037 ? ?

## 2022-03-01 NOTE — ED Triage Notes (Signed)
Went to the infusion center on 5/15  for a shot started w/ chills that night  ?Body aches - did  not check temp  ?Ibuprofen 400mg  at 0730 - also  TID yesterday  ?Was going to come yesterday but did not feel enough ?Ibuprofen & lidocaine patches helped with pain  ?

## 2022-03-01 NOTE — Discharge Instructions (Signed)
Your chills, sweats, and body aches are likely a reaction to the medication.  They will resolve over time ?May take Tylenol for pain ?Drink lots of water ?See your doctor if symptoms persist ?

## 2022-05-02 DIAGNOSIS — E782 Mixed hyperlipidemia: Secondary | ICD-10-CM | POA: Diagnosis not present

## 2022-05-02 DIAGNOSIS — E113292 Type 2 diabetes mellitus with mild nonproliferative diabetic retinopathy without macular edema, left eye: Secondary | ICD-10-CM | POA: Diagnosis not present

## 2022-05-02 DIAGNOSIS — E039 Hypothyroidism, unspecified: Secondary | ICD-10-CM | POA: Diagnosis not present

## 2022-05-02 DIAGNOSIS — M810 Age-related osteoporosis without current pathological fracture: Secondary | ICD-10-CM | POA: Diagnosis not present

## 2022-05-02 DIAGNOSIS — Z7984 Long term (current) use of oral hypoglycemic drugs: Secondary | ICD-10-CM | POA: Diagnosis not present

## 2022-05-05 DIAGNOSIS — M25461 Effusion, right knee: Secondary | ICD-10-CM | POA: Diagnosis not present

## 2022-05-05 DIAGNOSIS — M85861 Other specified disorders of bone density and structure, right lower leg: Secondary | ICD-10-CM | POA: Diagnosis not present

## 2022-05-05 DIAGNOSIS — Z885 Allergy status to narcotic agent status: Secondary | ICD-10-CM | POA: Diagnosis not present

## 2022-05-05 DIAGNOSIS — M222X1 Patellofemoral disorders, right knee: Secondary | ICD-10-CM | POA: Diagnosis not present

## 2022-05-05 DIAGNOSIS — E119 Type 2 diabetes mellitus without complications: Secondary | ICD-10-CM | POA: Diagnosis not present

## 2022-05-05 DIAGNOSIS — Z791 Long term (current) use of non-steroidal anti-inflammatories (NSAID): Secondary | ICD-10-CM | POA: Diagnosis not present

## 2022-05-05 DIAGNOSIS — M7989 Other specified soft tissue disorders: Secondary | ICD-10-CM | POA: Diagnosis not present

## 2022-05-05 DIAGNOSIS — H40021 Open angle with borderline findings, high risk, right eye: Secondary | ICD-10-CM | POA: Diagnosis not present

## 2022-05-05 DIAGNOSIS — Z79899 Other long term (current) drug therapy: Secondary | ICD-10-CM | POA: Diagnosis not present

## 2022-05-05 DIAGNOSIS — R2689 Other abnormalities of gait and mobility: Secondary | ICD-10-CM | POA: Diagnosis not present

## 2022-05-05 DIAGNOSIS — Z7984 Long term (current) use of oral hypoglycemic drugs: Secondary | ICD-10-CM | POA: Diagnosis not present

## 2022-05-05 DIAGNOSIS — S8391XA Sprain of unspecified site of right knee, initial encounter: Secondary | ICD-10-CM | POA: Diagnosis not present

## 2022-05-05 DIAGNOSIS — H40012 Open angle with borderline findings, low risk, left eye: Secondary | ICD-10-CM | POA: Diagnosis not present

## 2022-05-05 DIAGNOSIS — Z7982 Long term (current) use of aspirin: Secondary | ICD-10-CM | POA: Diagnosis not present

## 2022-05-05 DIAGNOSIS — M25561 Pain in right knee: Secondary | ICD-10-CM | POA: Diagnosis not present

## 2022-05-08 DIAGNOSIS — E119 Type 2 diabetes mellitus without complications: Secondary | ICD-10-CM | POA: Diagnosis not present

## 2022-05-08 DIAGNOSIS — M1712 Unilateral primary osteoarthritis, left knee: Secondary | ICD-10-CM | POA: Diagnosis not present

## 2022-05-08 DIAGNOSIS — M79662 Pain in left lower leg: Secondary | ICD-10-CM | POA: Diagnosis not present

## 2022-05-08 DIAGNOSIS — M76892 Other specified enthesopathies of left lower limb, excluding foot: Secondary | ICD-10-CM | POA: Diagnosis not present

## 2022-05-08 DIAGNOSIS — S52612A Displaced fracture of left ulna styloid process, initial encounter for closed fracture: Secondary | ICD-10-CM | POA: Diagnosis not present

## 2022-05-08 DIAGNOSIS — S52572A Other intraarticular fracture of lower end of left radius, initial encounter for closed fracture: Secondary | ICD-10-CM | POA: Diagnosis not present

## 2022-05-08 DIAGNOSIS — M79605 Pain in left leg: Secondary | ICD-10-CM | POA: Diagnosis not present

## 2022-05-08 DIAGNOSIS — M25532 Pain in left wrist: Secondary | ICD-10-CM | POA: Diagnosis not present

## 2022-05-08 DIAGNOSIS — Z791 Long term (current) use of non-steroidal anti-inflammatories (NSAID): Secondary | ICD-10-CM | POA: Diagnosis not present

## 2022-05-08 DIAGNOSIS — S52352A Displaced comminuted fracture of shaft of radius, left arm, initial encounter for closed fracture: Secondary | ICD-10-CM | POA: Diagnosis not present

## 2022-05-08 DIAGNOSIS — Z79899 Other long term (current) drug therapy: Secondary | ICD-10-CM | POA: Diagnosis not present

## 2022-05-08 DIAGNOSIS — W19XXXA Unspecified fall, initial encounter: Secondary | ICD-10-CM | POA: Diagnosis not present

## 2022-05-08 DIAGNOSIS — Z7982 Long term (current) use of aspirin: Secondary | ICD-10-CM | POA: Diagnosis not present

## 2022-05-08 DIAGNOSIS — S52502A Unspecified fracture of the lower end of left radius, initial encounter for closed fracture: Secondary | ICD-10-CM | POA: Diagnosis not present

## 2022-05-08 DIAGNOSIS — S0990XA Unspecified injury of head, initial encounter: Secondary | ICD-10-CM | POA: Diagnosis not present

## 2022-05-08 DIAGNOSIS — Z7984 Long term (current) use of oral hypoglycemic drugs: Secondary | ICD-10-CM | POA: Diagnosis not present

## 2022-05-08 DIAGNOSIS — Z885 Allergy status to narcotic agent status: Secondary | ICD-10-CM | POA: Diagnosis not present

## 2022-05-08 DIAGNOSIS — E079 Disorder of thyroid, unspecified: Secondary | ICD-10-CM | POA: Diagnosis not present

## 2022-05-09 DIAGNOSIS — M25531 Pain in right wrist: Secondary | ICD-10-CM | POA: Diagnosis not present

## 2022-05-09 DIAGNOSIS — M25561 Pain in right knee: Secondary | ICD-10-CM | POA: Diagnosis not present

## 2022-05-09 DIAGNOSIS — M25461 Effusion, right knee: Secondary | ICD-10-CM | POA: Diagnosis not present

## 2022-05-09 DIAGNOSIS — G5601 Carpal tunnel syndrome, right upper limb: Secondary | ICD-10-CM | POA: Diagnosis not present

## 2022-05-10 DIAGNOSIS — G8918 Other acute postprocedural pain: Secondary | ICD-10-CM | POA: Diagnosis not present

## 2022-05-10 DIAGNOSIS — X58XXXA Exposure to other specified factors, initial encounter: Secondary | ICD-10-CM | POA: Diagnosis not present

## 2022-05-10 DIAGNOSIS — G5602 Carpal tunnel syndrome, left upper limb: Secondary | ICD-10-CM | POA: Diagnosis not present

## 2022-05-10 DIAGNOSIS — M25461 Effusion, right knee: Secondary | ICD-10-CM | POA: Diagnosis not present

## 2022-05-10 DIAGNOSIS — S52532A Colles' fracture of left radius, initial encounter for closed fracture: Secondary | ICD-10-CM | POA: Diagnosis not present

## 2022-05-10 DIAGNOSIS — M7989 Other specified soft tissue disorders: Secondary | ICD-10-CM | POA: Diagnosis not present

## 2022-05-10 DIAGNOSIS — Y999 Unspecified external cause status: Secondary | ICD-10-CM | POA: Diagnosis not present

## 2022-05-10 DIAGNOSIS — Z472 Encounter for removal of internal fixation device: Secondary | ICD-10-CM | POA: Diagnosis not present

## 2022-05-15 DIAGNOSIS — G5601 Carpal tunnel syndrome, right upper limb: Secondary | ICD-10-CM | POA: Diagnosis not present

## 2022-05-15 DIAGNOSIS — G5602 Carpal tunnel syndrome, left upper limb: Secondary | ICD-10-CM | POA: Diagnosis not present

## 2022-05-15 DIAGNOSIS — M25561 Pain in right knee: Secondary | ICD-10-CM | POA: Diagnosis not present

## 2022-05-25 DIAGNOSIS — M25561 Pain in right knee: Secondary | ICD-10-CM | POA: Diagnosis not present

## 2022-05-25 DIAGNOSIS — G5602 Carpal tunnel syndrome, left upper limb: Secondary | ICD-10-CM | POA: Diagnosis not present

## 2022-06-06 DIAGNOSIS — M25561 Pain in right knee: Secondary | ICD-10-CM | POA: Diagnosis not present

## 2022-06-06 DIAGNOSIS — M1711 Unilateral primary osteoarthritis, right knee: Secondary | ICD-10-CM | POA: Diagnosis not present

## 2022-06-06 DIAGNOSIS — Z9889 Other specified postprocedural states: Secondary | ICD-10-CM | POA: Diagnosis not present

## 2022-12-28 DIAGNOSIS — H52203 Unspecified astigmatism, bilateral: Secondary | ICD-10-CM | POA: Diagnosis not present

## 2022-12-28 DIAGNOSIS — H5203 Hypermetropia, bilateral: Secondary | ICD-10-CM | POA: Diagnosis not present

## 2022-12-28 DIAGNOSIS — H04212 Epiphora due to excess lacrimation, left lacrimal gland: Secondary | ICD-10-CM | POA: Diagnosis not present

## 2022-12-28 DIAGNOSIS — H04123 Dry eye syndrome of bilateral lacrimal glands: Secondary | ICD-10-CM | POA: Diagnosis not present

## 2022-12-28 DIAGNOSIS — H524 Presbyopia: Secondary | ICD-10-CM | POA: Diagnosis not present

## 2023-01-30 DIAGNOSIS — E119 Type 2 diabetes mellitus without complications: Secondary | ICD-10-CM | POA: Diagnosis not present

## 2023-01-30 DIAGNOSIS — Z0001 Encounter for general adult medical examination with abnormal findings: Secondary | ICD-10-CM | POA: Diagnosis not present

## 2023-01-30 DIAGNOSIS — D509 Iron deficiency anemia, unspecified: Secondary | ICD-10-CM | POA: Diagnosis not present

## 2023-01-30 DIAGNOSIS — E113292 Type 2 diabetes mellitus with mild nonproliferative diabetic retinopathy without macular edema, left eye: Secondary | ICD-10-CM | POA: Diagnosis not present

## 2023-01-30 DIAGNOSIS — E039 Hypothyroidism, unspecified: Secondary | ICD-10-CM | POA: Diagnosis not present

## 2023-01-30 DIAGNOSIS — M810 Age-related osteoporosis without current pathological fracture: Secondary | ICD-10-CM | POA: Diagnosis not present

## 2023-01-30 DIAGNOSIS — Z7984 Long term (current) use of oral hypoglycemic drugs: Secondary | ICD-10-CM | POA: Diagnosis not present

## 2023-01-30 DIAGNOSIS — E782 Mixed hyperlipidemia: Secondary | ICD-10-CM | POA: Diagnosis not present

## 2023-01-30 DIAGNOSIS — Z23 Encounter for immunization: Secondary | ICD-10-CM | POA: Diagnosis not present

## 2023-01-30 DIAGNOSIS — E7800 Pure hypercholesterolemia, unspecified: Secondary | ICD-10-CM | POA: Diagnosis not present

## 2023-04-14 IMAGING — MR MR KNEE*L* W/O CM
6 series · 40 of 40 positions shown · non-contrast
Comparison: Left knee radiograph June 2016

CLINICAL DATA: Left knee pain, began after getting up from the
floor on 02/13/2021

EXAM:
MRI OF THE LEFT KNEE WITHOUT CONTRAST
TECHNIQUE: Multiplanar, multisequence MR imaging of the knee was performed. No
intravenous contrast was administered.

[Series 6: T2 fat-sat · axial · left · 4.0mm · 0.50mm/px · z∈[-122,+32]mm · 9 of 36 slices shown (1 of 3)]
[im 1/36]
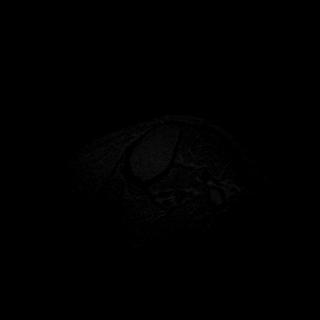
[im 5/36]
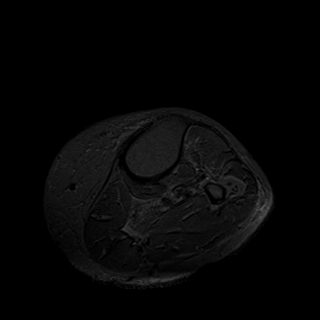
[im 9/36]
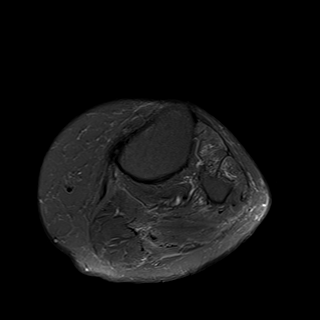
[im 14/36]
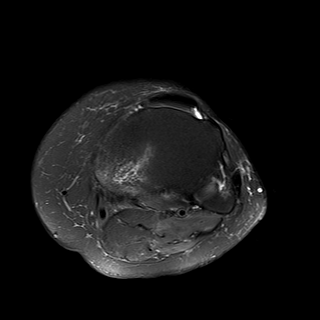
[im 18/36]
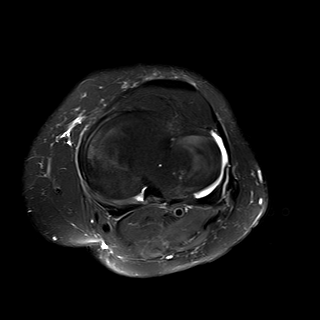
[im 22/36]
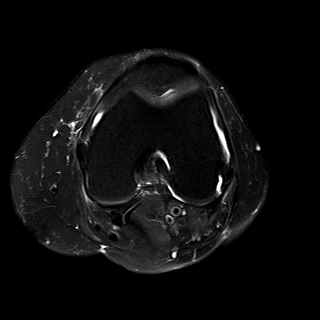
[im 27/36]
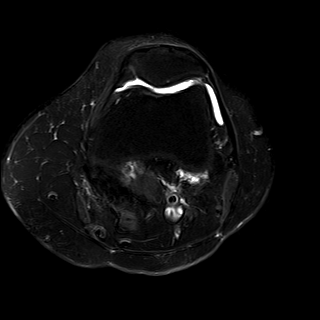
[im 31/36]
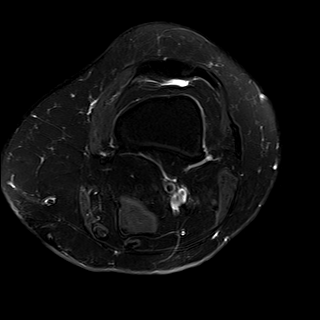
[im 36/36]
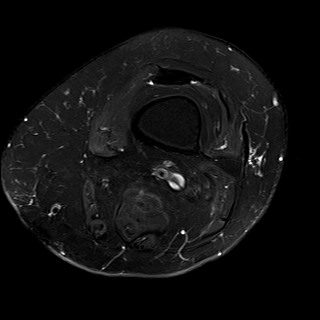

[Series 7: T2 fat-sat · coronal · left · 4.0mm · 0.47mm/px · 6 of 26 slices shown (2 of 3)]
[im 1/26]
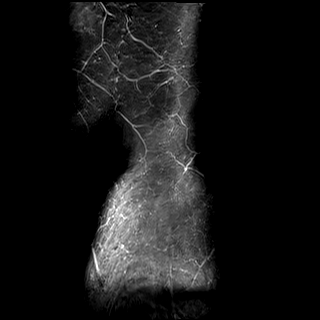
[im 6/26]
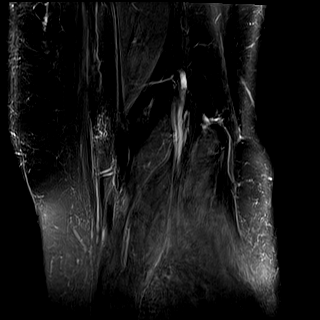
[im 11/26]
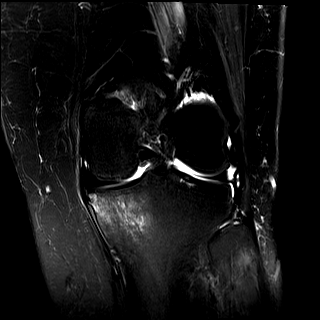
[im 16/26]
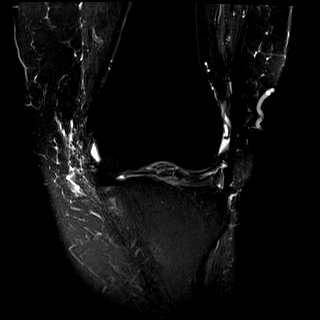
[im 21/26]
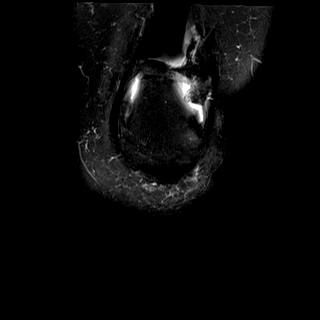
[im 26/26]
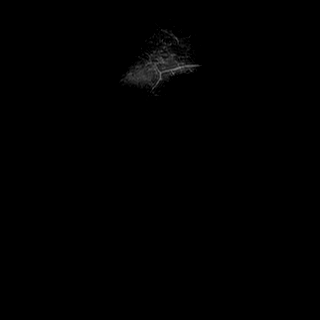

[Series 8: T1 · coronal · left · 4.0mm · 0.47mm/px · 6 of 26 slices shown]
[im 1/26]
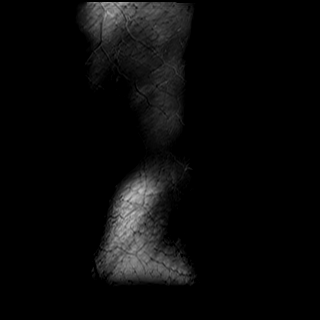
[im 6/26]
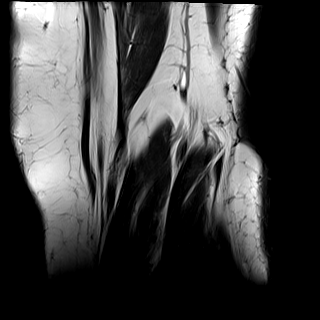
[im 11/26]
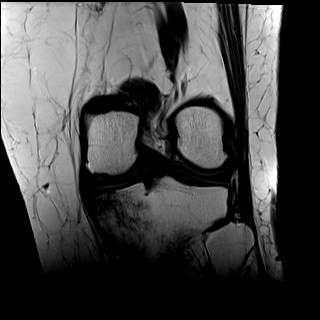
[im 16/26]
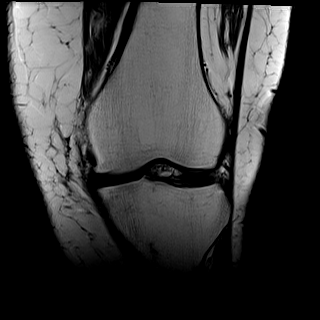
[im 21/26]
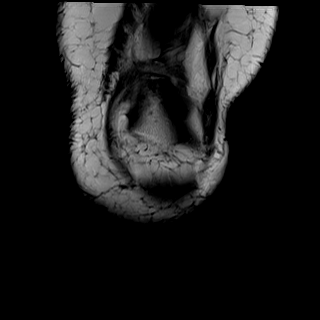
[im 26/26]
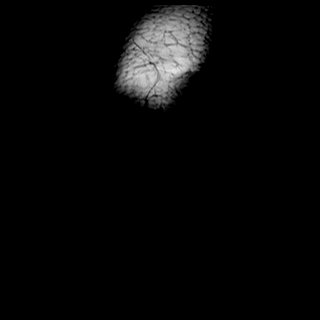

[Series 9: PD fat-sat · coronal · left · 3.0mm · 0.47mm/px · 7 of 28 slices shown (1 of 2)]
[im 1/28]
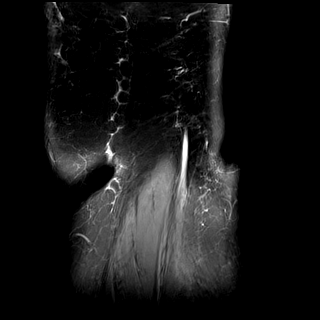
[im 5/28]
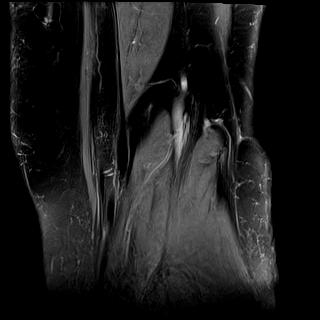
[im 10/28]
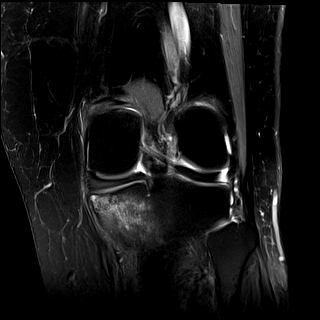
[im 14/28]
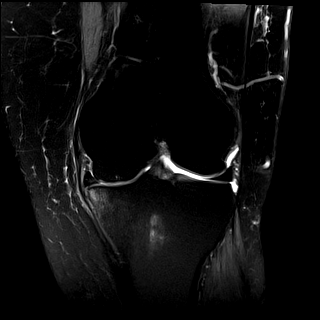
[im 19/28]
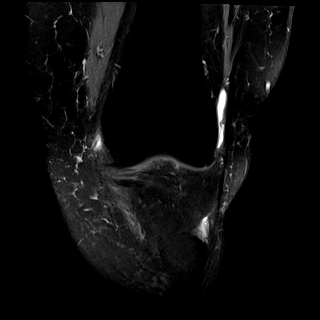
[im 23/28]
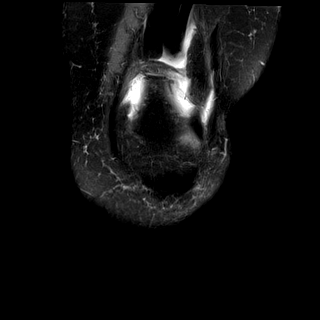
[im 28/28]
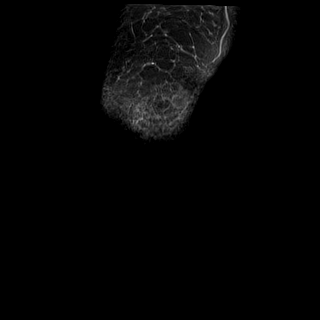

[Series 10: PD fat-sat · sagittal · left · 3.0mm · 0.47mm/px · 6 of 27 slices shown (2 of 2)]
[im 1/27]
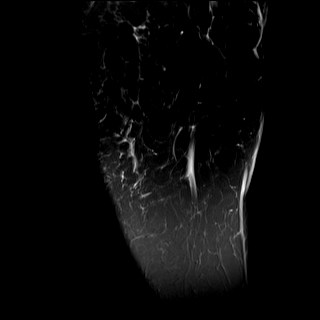
[im 6/27]
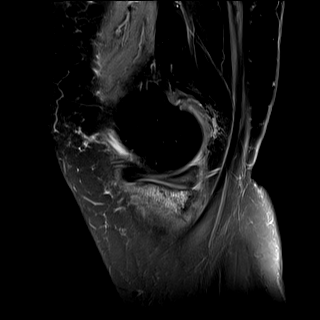
[im 11/27]
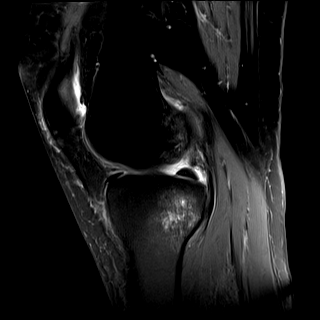
[im 16/27]
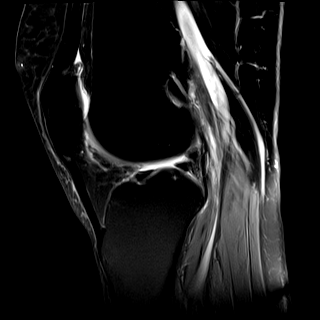
[im 21/27]
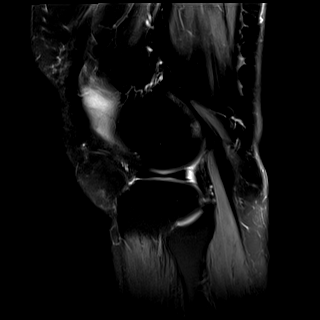
[im 27/27]
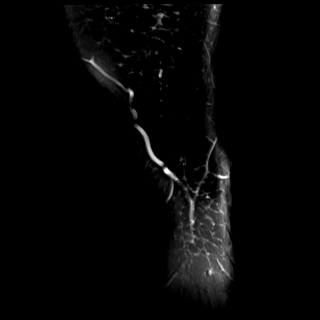

[Series 11: T2 fat-sat · sagittal · left · 3.0mm · 0.47mm/px · 6 of 27 slices shown (3 of 3)]
[im 1/27]
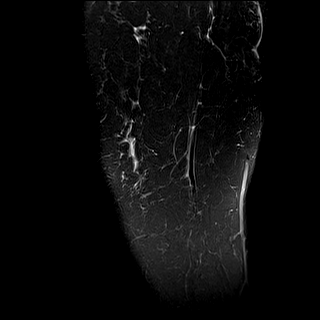
[im 6/27]
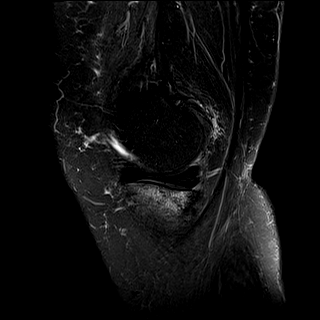
[im 11/27]
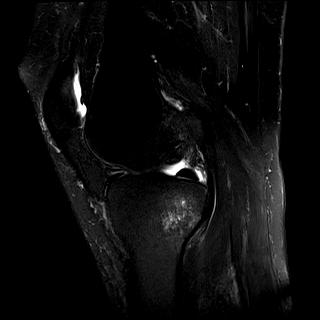
[im 16/27]
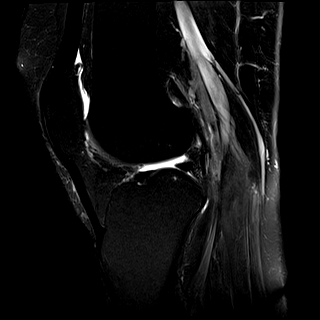
[im 21/27]
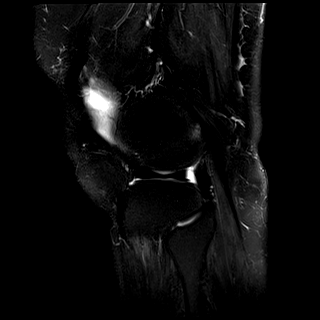
[im 27/27]
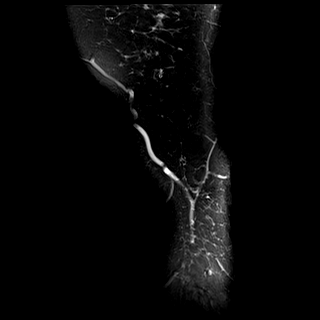

[40 of 40 positions shown; findings below may reference images not displayed]

FINDINGS: MENISCI

Medial: Extensive degenerative signal within the posterior horn and
body of the medial meniscus, with likely nondisplaced tear extending
to the free edge of the meniscal body.

Lateral: Intact.

LIGAMENTS

Cruciates: ACL and PCL are intact.

Collaterals: Medial collateral ligament is intact. Lateral
collateral ligament complex is intact.

CARTILAGE

Patellofemoral: 4 mm partial-thickness defect along the lateral
patellar facet (axial T2 image 12).

Medial: Moderate-severe chondrosis with near full-thickness loss
along the weight-bearing medial femoral condyle.

Lateral: Mild chondrosis with small focal cartilage abnormality and
subchondral edema in the posterolateral tibial plateau (coronal T2
image 11).

JOINT: Small joint effusion.

POPLITEAL FOSSA: Miniscule Baker cyst. Pericapsular ganglion cyst
formation along the posterior aspect of the medial femoral condyle
measuring 6 mm (axial T2 image 13).

EXTENSOR MECHANISM: Intact quadriceps tendon. Intact patellar
tendon. Intact lateral patellar retinaculum. Intact medial patellar
retinaculum. Intact MPFL.

BONES: There is a nondisplaced medial tibial plateau insufficiency
fracture with associated bony edema.

Other: No fluid collection or hematoma. Muscles are normal.
IMPRESSION: Nondisplaced insufficiency fracture of the medial tibial plateau
with associated bony edema.

Extensive degenerative intrasubstance signal within the posterior
horn and body of the medial meniscus, with likely nondisplaced tear
extending to the free edge of the meniscal body. Moderate-severe
medial compartment chondrosis with near full-thickness cartilage
loss along the weight-bearing medial femoral condyle.

4 mm partial-thickness chondral defect along the lateral patellar
facet. Mild lateral compartment chondrosis with small focal
cartilage abnormality and subchondral edema in the posterolateral
tibial plateau.

Small joint effusion.

## 2023-12-19 ENCOUNTER — Ambulatory Visit
Admission: EM | Admit: 2023-12-19 | Discharge: 2023-12-19 | Disposition: A | Discharge status: Home / Self Care | Attending: Emergency Medicine | Admitting: Emergency Medicine

## 2023-12-19 ENCOUNTER — Encounter: Payer: Self-pay | Admitting: Emergency Medicine

## 2023-12-19 DIAGNOSIS — B349 Viral infection, unspecified: Secondary | ICD-10-CM | POA: Diagnosis not present

## 2023-12-19 MED ORDER — BENZONATATE 100 MG PO CAPS
100.0000 mg | ORAL_CAPSULE | Freq: Three times a day (TID) | ORAL | 0 refills | Status: AC | PRN
Start: 2023-12-19 — End: ?

## 2023-12-19 NOTE — ED Provider Notes (Signed)
 [facility]    CSN: [id number] Arrival date & time: 12/19/23  1627      History   Chief Complaint Chief Complaint  Patient presents with   Body chills    HPI Allison [name] is a 68 y.o. female.  1 day history of chills, fatigue, a little cough No fever, sore throat, congestion, abdominal pain, NVD, body aches Used ibuprofen once that helped No known sick contacts No recent travel  Past Medical History:  Diagnosis Date   Diabetes mellitus without complication (HCC)    Thyroid disease     Patient Active Problem List   Diagnosis Date Noted   Osteoporosis, senile 03/01/2022   Type 2 diabetes mellitus (HCC) 03/01/2022   Hypothyroidism 03/01/2022   B12 deficiency 03/01/2022   Mixed hyperlipidemia 03/01/2022    History reviewed. No pertinent surgical history.  OB History   No obstetric history on file.      Home Medications    Prior to Admission medications   Medication Sig Start Date End Date Taking? Authorizing Provider  benzonatate (TESSALON) 100 MG capsule Take 1 capsule (100 mg total) by mouth 3 (three) times daily as needed for cough. 12/19/23  Yes Dawan Farney, Ray Church  Cholecalciferol 25 MCG (1000 UT) tablet Take by mouth. 09/10/20  Yes [provider]  glimepiride (AMARYL) 2 MG tablet Take 2 mg by mouth every morning. 02/14/22  Yes [provider]  levothyroxine (SYNTHROID) 100 MCG tablet Take by mouth. 02/14/22  Yes [provider]  metFORMIN (GLUCOPHAGE) 1000 MG tablet Take 1,000 mg by mouth 2 (two) times daily. 08/29/14  Yes [provider]  pioglitazone (ACTOS) 45 MG tablet 1 tablet 08/31/14  Yes [provider]  vitamin B-12 (CYANOCOBALAMIN) 100 MCG tablet See admin instructions.   Yes [provider]  acetaminophen (TYLENOL) 500 MG tablet Take 1 tablet (500 mg total) by mouth every 6 (six) hours as needed. Patient not taking: Reported on 03/01/2022 09/21/14   Joycie Peek, PA-C    Family  History Family History  Problem Relation Age of Onset   Diabetes Father     Social History Social History   Tobacco Use   Smoking status: Never  Vaping Use   Vaping status: Never Used  Substance Use Topics   Alcohol use: Not Currently   Drug use: Never     Allergies   Hydrocodone   Review of Systems Review of Systems Per HPI  Physical Exam Triage Vital Signs ED Triage Vitals  Encounter Vitals Group     BP 12/19/23 1747 106/67     Systolic BP Percentile --      Diastolic BP Percentile --      Pulse Rate 12/19/23 1747 83     Resp 12/19/23 1747 18     Temp 12/19/23 1747 98.6 F (37 C)     Temp Source 12/19/23 1747 Oral     SpO2 12/19/23 1747 95 %     Weight 12/19/23 1748 154 lb 15.7 oz (70.3 kg)     Height 12/19/23 1748 5\' 3"  (1.6 m)     Head Circumference --      Peak Flow --      Pain Score 12/19/23 1748 0     Pain Loc --      Pain Education --      Exclude from Growth Chart --    No data found.  Updated Vital Signs BP 106/67 (BP Location: Left Arm)   Pulse 83  Temp 98.6 F (37 C) (Oral)   Resp 18   Ht 5\' 3"  (1.6 m)   Wt 154 lb 15.7 oz (70.3 kg)   SpO2 95%   BMI 27.45 kg/m   Visual Acuity Right Eye Distance:   Left Eye Distance:   Bilateral Distance:    Right Eye Near:   Left Eye Near:    Bilateral Near:     Physical Exam Vitals and nursing note reviewed.  Constitutional:      Appearance: She is not ill-appearing.  HENT:     Right Ear: Tympanic membrane and ear canal normal.     Left Ear: Tympanic membrane and ear canal normal.     Nose: No congestion or rhinorrhea.     Mouth/Throat:     Mouth: Mucous membranes are moist.     Pharynx: Oropharynx is clear. No posterior oropharyngeal erythema.  Eyes:     Conjunctiva/sclera: Conjunctivae normal.  Cardiovascular:     Rate and Rhythm: Normal rate and regular rhythm.     Pulses: Normal pulses.     Heart sounds: Normal heart sounds.  Pulmonary:     Effort: Pulmonary effort is normal.  No respiratory distress.     Breath sounds: Normal breath sounds. No wheezing or rales.  Musculoskeletal:     Cervical back: Normal range of motion.  Lymphadenopathy:     Cervical: No cervical adenopathy.  Skin:    General: Skin is warm and dry.  Neurological:     Mental Status: She is alert and oriented to person, place, and time.      UC Treatments / Results  Labs (all labs ordered are listed, but only abnormal results are displayed) Labs Reviewed - No data to display  EKG   Radiology No results found.  Procedures Procedures (including critical care time)  Medications Ordered in UC Medications - No data to display  Initial Impression / Assessment and Plan / UC Course  I have reviewed the triage vital signs and the nursing notes.  Pertinent labs & imaging results that were available during my care of the patient were reviewed by me and considered in my medical decision making (see chart for details).   Afebrile, well-appearing, clear lungs Symptoms just started yesterday, mild Discussed likely start of viral illness.  Advised symptomatic and supportive care.  Sent Tessalon to use for cough if needed.  Advised likely viral prognosis, reasons to return to clinic.  Patient is agreeable to plan, all questions answered  Final Clinical Impressions(s) / UC Diagnoses   Final diagnoses:  Viral illness     Discharge Instructions      You likely have a respiratory virus causing your symptoms You can alternate tylenol and ibuprofen every 4 hours for chills and body aches. Drink lots of fluids! The tessalon cough pills can be taken three times daily. If this medication makes you drowsy, take only one pill before bed. It may take 4-5 days for symptoms to begin improving      ED Prescriptions     Medication Sig Dispense Auth. Provider   benzonatate (TESSALON) 100 MG capsule Take 1 capsule (100 mg total) by mouth 3 (three) times daily as needed for cough. 30 capsule  Hanaa Payes, Lurena Joiner, PA-C      PDMP not reviewed this encounter.   Marlow Baars, [place] 12/19/23 1610

## 2023-12-19 NOTE — Discharge Instructions (Addendum)
 You likely have a respiratory virus causing your symptoms You can alternate tylenol and ibuprofen every 4 hours for chills and body aches. Drink lots of fluids! The tessalon cough pills can be taken three times daily. If this medication makes you drowsy, take only one pill before bed. It may take 4-5 days for symptoms to begin improving

## 2023-12-19 NOTE — ED Triage Notes (Signed)
 Patient c/o body chills, cough and feeling thirsty x 2 days.  Patient denies a HA.  Patient has taken Ibuprofen.
